# Patient Record
Sex: Male | Born: 1975 | Race: White | Hispanic: No | Marital: Single | Smoking: Never smoker
Health system: Southern US, Community
[De-identification: ages and names within clinical notes are randomized; demographics above are authoritative.]

## PROBLEM LIST (undated history)

## (undated) DIAGNOSIS — F329 Major depressive disorder, single episode, unspecified: Secondary | ICD-10-CM

## (undated) DIAGNOSIS — G4700 Insomnia, unspecified: Secondary | ICD-10-CM

## (undated) DIAGNOSIS — T7840XA Allergy, unspecified, initial encounter: Secondary | ICD-10-CM

## (undated) DIAGNOSIS — F32A Depression, unspecified: Secondary | ICD-10-CM

## (undated) DIAGNOSIS — F419 Anxiety disorder, unspecified: Secondary | ICD-10-CM

## (undated) HISTORY — DX: Major depressive disorder, single episode, unspecified: F32.9

## (undated) HISTORY — DX: Anxiety disorder, unspecified: F41.9

## (undated) HISTORY — DX: Insomnia, unspecified: G47.00

## (undated) HISTORY — DX: Depression, unspecified: F32.A

## (undated) HISTORY — DX: Allergy, unspecified, initial encounter: T78.40XA

---

## 2011-01-24 ENCOUNTER — Ambulatory Visit: Payer: Self-pay | Admitting: Family Medicine

## 2012-08-16 IMAGING — US US THYROID
1 series · 18 of 25 positions shown · non-contrast
Comparison: none

REASON FOR EXAM: goiter hypothyroidism
COMMENTS:

PROCEDURE:     FERIENHAUS - FERIENHAUS THYROID  - January 24, 2011  [DATE]
RESULT:

[Series 1: us thyroid · 18 of 32 slices shown]
[im 1/32]
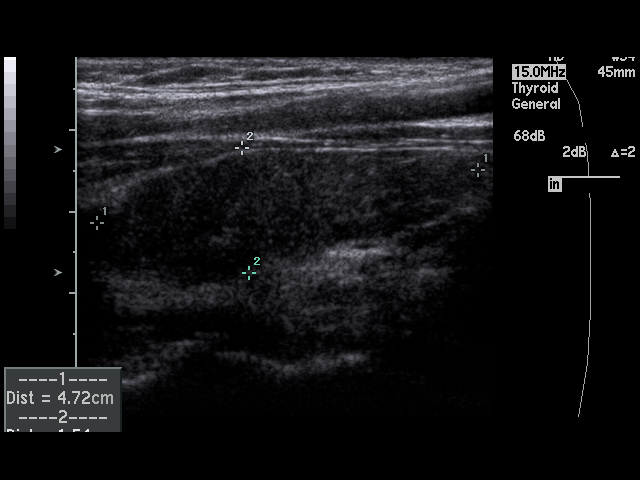
[im 3/32]
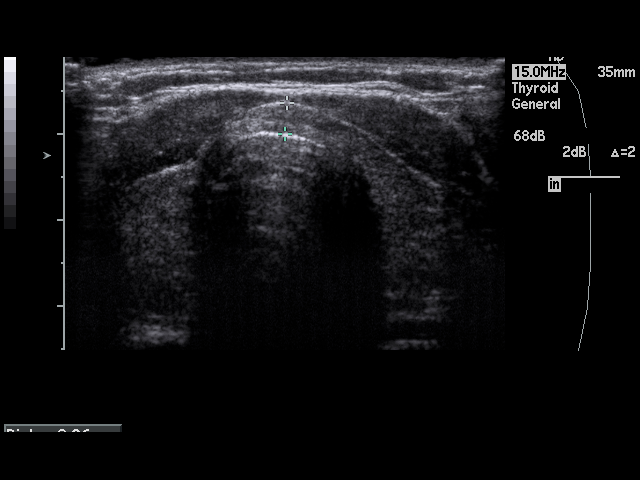
[im 4/32]
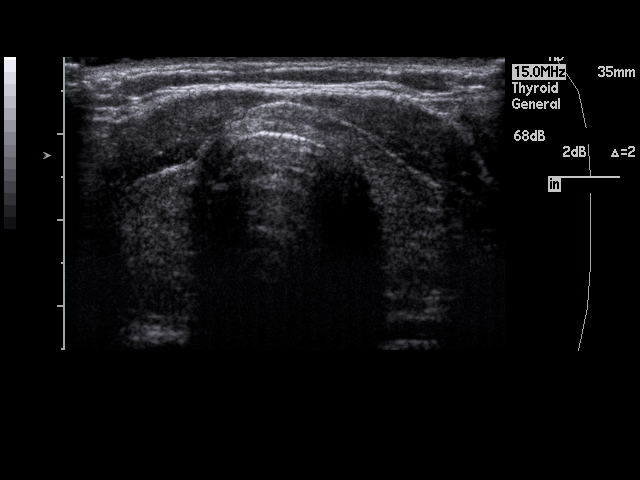
[im 6/32]
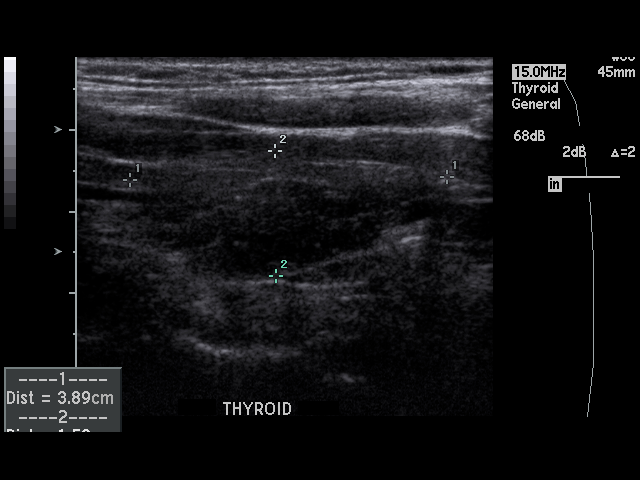
[im 8/32]
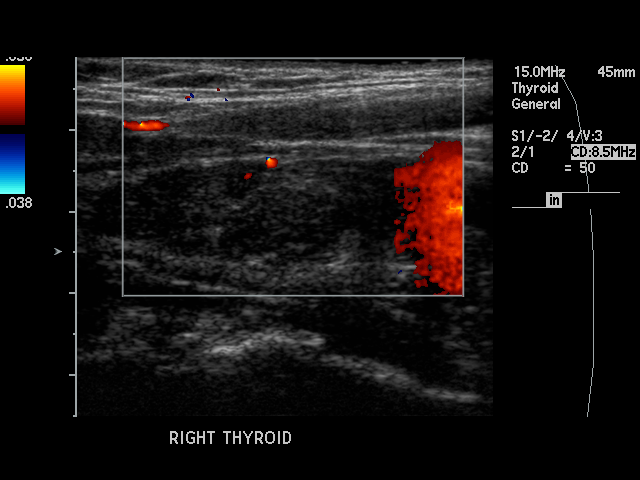
[im 10/32]
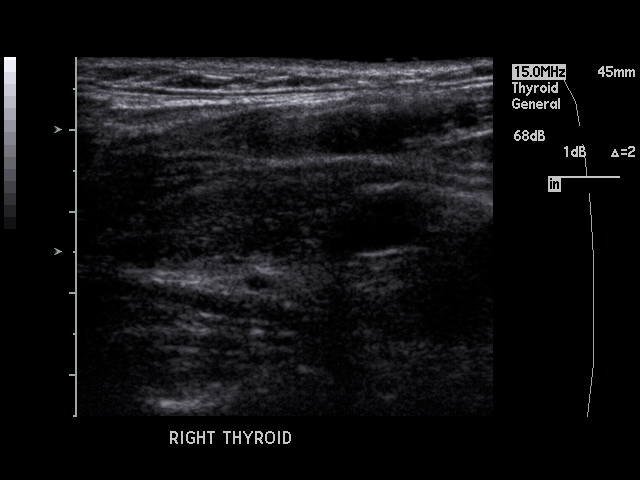
[im 12/32]
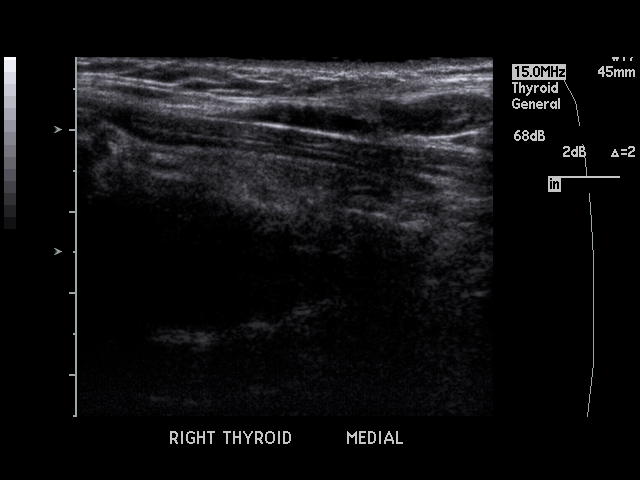
[im 13/32]
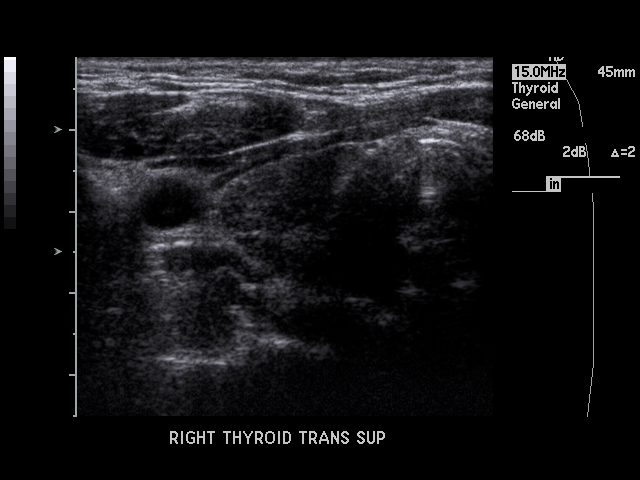
[im 15/32]
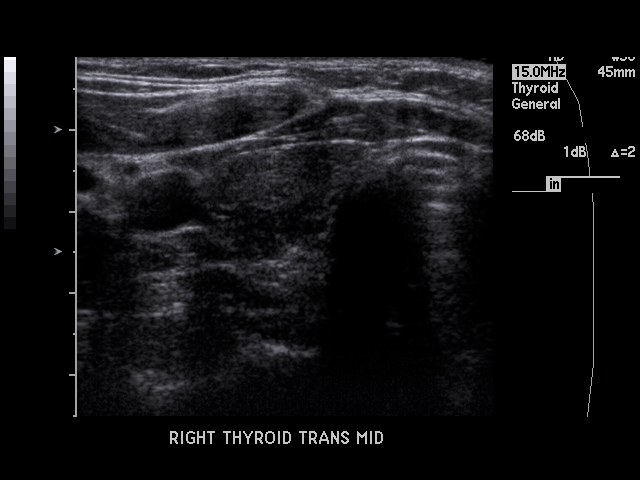
[im 17/32]
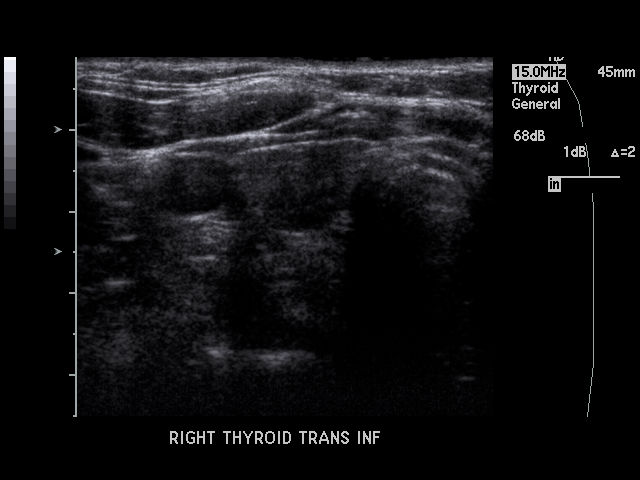
[im 19/32]
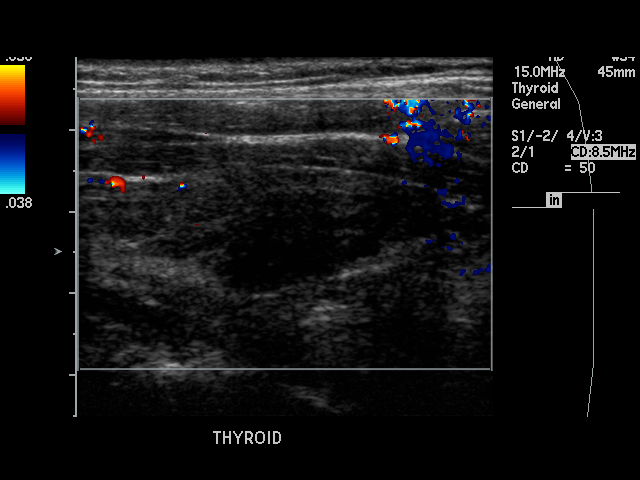
[im 20/32]
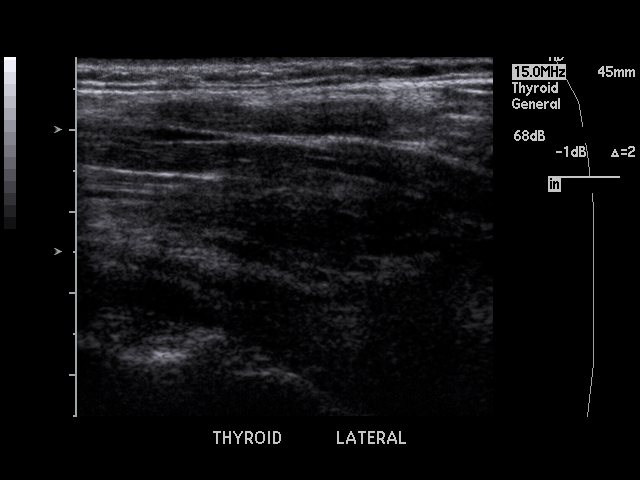
[im 22/32]
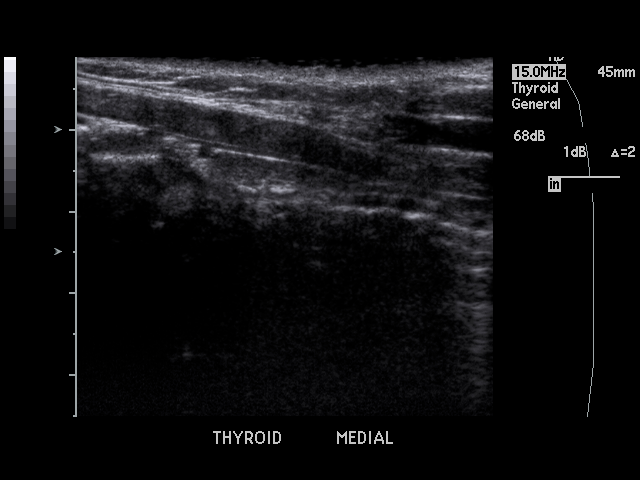
[im 24/32]
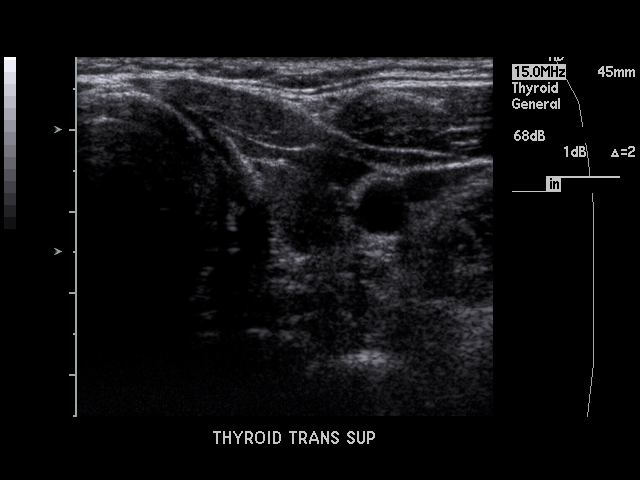
[im 26/32]
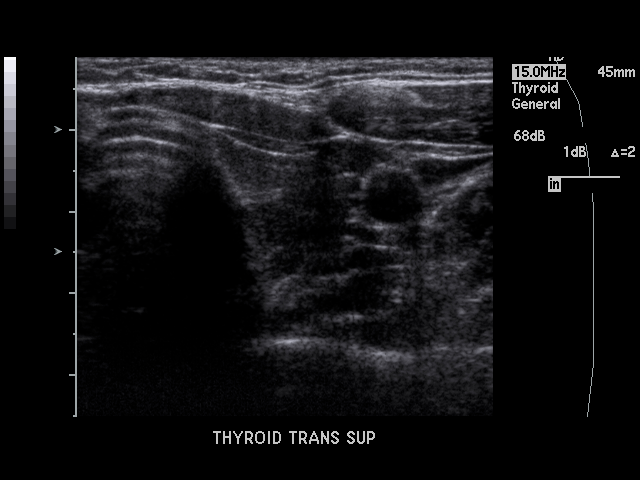
[im 28/32]
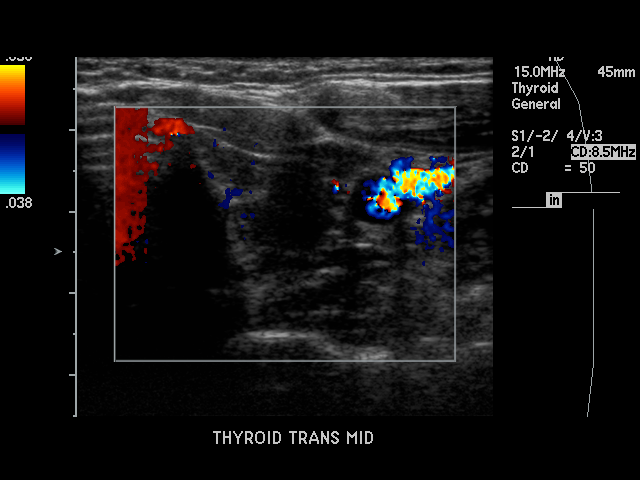
[im 29/32]
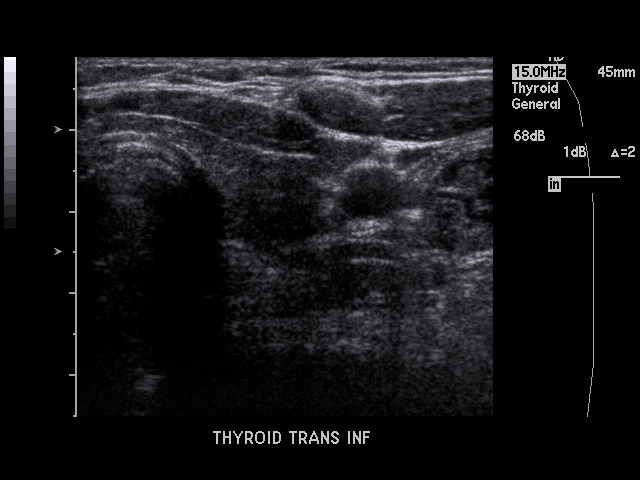
[im 32/32]
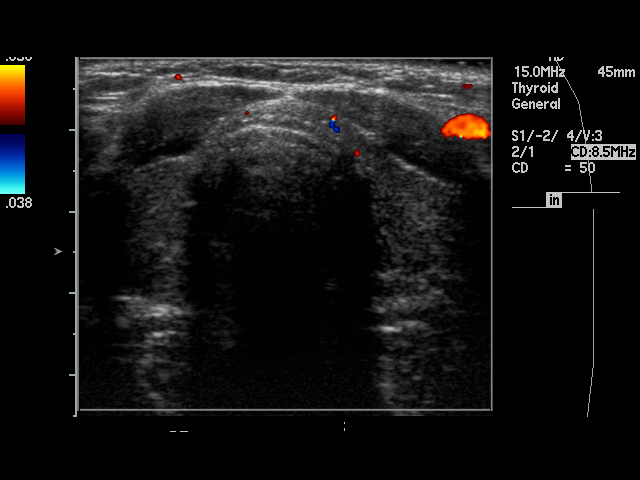

[18 of 25 positions shown; findings below may reference images not displayed]

FINDINGS: The right lobe of the thyroid measures 4.7 x 1.5 x 1.6 cm and the
left measures 3.9 x 1.5 x 1.3 cm. Isthmus thickness is 0.36 cm. The thyroid
demonstrates a homogeneous echotexture. There is no evidence of solid or
cystic masses.
IMPRESSION: Unremarkable Thyroid Ultrasound.

## 2015-07-13 ENCOUNTER — Ambulatory Visit (INDEPENDENT_AMBULATORY_CARE_PROVIDER_SITE_OTHER): Payer: BLUE CROSS/BLUE SHIELD | Admitting: Family Medicine

## 2015-07-13 ENCOUNTER — Encounter: Payer: Self-pay | Admitting: Family Medicine

## 2015-07-13 VITALS — BP 136/77 | HR 100 | Temp 98.4°F | Resp 20 | Ht 73.0 in | Wt 208.8 lb

## 2015-07-13 DIAGNOSIS — F329 Major depressive disorder, single episode, unspecified: Secondary | ICD-10-CM | POA: Diagnosis not present

## 2015-07-13 DIAGNOSIS — I10 Essential (primary) hypertension: Secondary | ICD-10-CM | POA: Insufficient documentation

## 2015-07-13 DIAGNOSIS — G4700 Insomnia, unspecified: Secondary | ICD-10-CM | POA: Diagnosis not present

## 2015-07-13 DIAGNOSIS — Z23 Encounter for immunization: Secondary | ICD-10-CM | POA: Diagnosis not present

## 2015-07-13 DIAGNOSIS — F32A Depression, unspecified: Secondary | ICD-10-CM

## 2015-07-13 DIAGNOSIS — IMO0001 Reserved for inherently not codable concepts without codable children: Secondary | ICD-10-CM

## 2015-07-13 DIAGNOSIS — F330 Major depressive disorder, recurrent, mild: Secondary | ICD-10-CM | POA: Insufficient documentation

## 2015-07-13 DIAGNOSIS — R030 Elevated blood-pressure reading, without diagnosis of hypertension: Secondary | ICD-10-CM

## 2015-07-13 MED ORDER — TRAZODONE HCL 50 MG PO TABS
50.0000 mg | ORAL_TABLET | Freq: Every evening | ORAL | Status: DC | PRN
Start: 2015-07-13 — End: 2017-12-13

## 2015-07-13 MED ORDER — DESVENLAFAXINE ER 100 MG PO TB24: 1.0000 | ORAL_TABLET | Freq: Every day | ORAL | Status: DC

## 2015-07-13 NOTE — Progress Notes (Signed)
Name: Danny Chase   MRN: [id number]    DOB: 10-Sep-1975   Date:07/13/2015       Progress Note  Subjective  Chief Complaint  Chief Complaint  Patient presents with  . Establish Care    NP/ BP Concerns    Insomnia Primary symptoms: no malaise/fatigue.  Past treatments include medication. Typical bedtime:  11-12 P.M..  How long after going to bed to you fall asleep: 15-30 minutes (With Trazodone, he falls asleep quickly).   PMH includes: depression.  Depression        This is a chronic problem.  The onset quality is gradual.   Associated symptoms include insomnia and sad.  Associated symptoms include no helplessness, no hopelessness, no headaches and no suicidal ideas.( Initially, he believes he had 'manic' symptoms, this included racing thoughts, insomnia, 'being wired' etc)  Past treatments include SNRIs - Serotonin and norepinephrine reuptake inhibitors.  Compliance with treatment is good.  Previous treatment provided significant relief.  Risk factors include family history of mental illness.  Elevated Blood Pressure Pt. Presents with concerns about elevated Blood Pressure. He reports pressure at Psychiatrist's office was 150/90 in December 2016. Similarly at work for wellness exam, his Blood pressure was elevated but does not remember the exact numbers. Pt. Reports while growing up, he had low blood pressure.  Past Medical History  Diagnosis Date  . Allergy   . Anxiety   . Depression   . Insomnia     History reviewed. No pertinent past surgical history.  Family History  Problem Relation Age of Onset  . Cancer Mother     Lymphoma  . Cancer Father     Lymphoma, Colon CA  . Hypertension Brother     Social History   Social History  . Marital Status: Single    Spouse Name: N/A  . Number of Children: N/A  . Years of Education: N/A   Occupational History  . Not on file.   Social History Main Topics  . Smoking status: Never Smoker   . Smokeless tobacco: Never Used  .  Alcohol Use: 24.0 oz/week    40 Cans of beer per week     Comment: 6 beers/day  . Drug Use: No  . Sexual Activity: Yes   Other Topics Concern  . Not on file   Social History Narrative  . No narrative on file    Current outpatient prescriptions:  .  cetirizine (ZYRTEC) 10 MG tablet, Take 10 mg by mouth at bedtime., Disp: , Rfl:  .  Desvenlafaxine ER 100 MG TB24, Take 1 tablet by mouth daily., Disp: , Rfl: 11 .  Ergocalciferol (VITAMIN D2 PO), Take 1.25 mg by mouth 2 (two) times a week., Disp: , Rfl:  .  fluticasone (FLONASE) 50 MCG/ACT nasal spray, Place 2 sprays into both nostrils daily., Disp: , Rfl: 4 .  traZODone (DESYREL) 50 MG tablet, Take 50 mg by mouth at bedtime., Disp: , Rfl:   No Known Allergies   Review of Systems  Constitutional: Negative for malaise/fatigue.  Eyes: Negative for blurred vision.  Cardiovascular: Negative for chest pain.  Neurological: Negative for headaches.  Psychiatric/Behavioral: Positive for depression. Negative for suicidal ideas. The patient has insomnia.      Objective  Filed Vitals:   07/13/15 0939  BP: 136/77  Pulse: 100  Temp: 98.4 F (36.9 C)  TempSrc: Oral  Resp: 20  Height:  (1.854 m)  Weight: 208 lb 12.8 oz (94.711 kg)  SpO2: 96%  Physical Exam  Constitutional: He is oriented to person, place, and time and well-developed, well-nourished, and in no distress.  HENT:  Head: Normocephalic and atraumatic.  Cardiovascular: Normal rate and regular rhythm.   Pulmonary/Chest: Effort normal and breath sounds normal.  Abdominal: Soft. Bowel sounds are normal.  Musculoskeletal: He exhibits no edema.  Neurological: He is alert and oriented to person, place, and time.  Psychiatric: Mood, memory, affect and judgment normal.  Nursing note and vitals reviewed.    Assessment & Plan  1. Need for immunization against influenza  - Flu Vaccine QUAD 36+ mos PF IM (Fluarix & Fluzone Quad PF)  2. Depression Continue on  Desferal venlafaxine, we will wait and review records from psychiatry when available. - Desvenlafaxine ER 100 MG TB24; Take 1 tablet (100 mg total) by mouth daily.  Dispense: 30 tablet; Refill: 5  3. Insomnia Insomnia responsive to treatment. Continue. - traZODone (DESYREL) 50 MG tablet; Take 1 tablet (50 mg total) by mouth at bedtime as needed for sleep.  Dispense: 30 tablet; Refill: 5  4. Elevated BP Recommended to check and record BP readings in log and will be reviewed in one month.    Jove Beyl [facility] 07/13/2015 10:00 AM

## 2015-08-15 ENCOUNTER — Ambulatory Visit (INDEPENDENT_AMBULATORY_CARE_PROVIDER_SITE_OTHER): Payer: BLUE CROSS/BLUE SHIELD | Admitting: Family Medicine

## 2015-08-15 ENCOUNTER — Encounter: Payer: Self-pay | Admitting: Family Medicine

## 2015-08-15 VITALS — BP 152/118 | HR 22 | Temp 98.7°F | Resp 109 | Ht 73.0 in | Wt 206.7 lb

## 2015-08-15 DIAGNOSIS — I10 Essential (primary) hypertension: Secondary | ICD-10-CM

## 2015-08-15 MED ORDER — ATENOLOL 50 MG PO TABS: 50.0000 mg | ORAL_TABLET | Freq: Every day | ORAL | Status: DC

## 2015-08-15 NOTE — Progress Notes (Signed)
Name: Danny Chase   MRN: [id number]    DOB: [birth year]   Date:08/15/2015       Progress Note  Subjective  Chief Complaint  Chief Complaint  Patient presents with  . Follow-up    1 MO    Hypertension This is a new problem. The problem is uncontrolled. Pertinent negatives include no blurred vision, chest pain, headaches, palpitations or shortness of breath. Past treatments include nothing. There is no history of kidney disease, CAD/MI or CVA.      Past Medical History  Diagnosis Date  . Allergy   . Anxiety   . Depression   . Insomnia     History reviewed. No pertinent past surgical history.  Family History  Problem Relation Age of Onset  . Cancer Mother     Lymphoma  . Cancer Father     Lymphoma, Colon CA  . Hypertension Brother     Social History   Social History  . Marital Status: Single    Spouse Name: N/A  . Number of Children: N/A  . Years of Education: N/A   Occupational History  . Not on file.   Social History Main Topics  . Smoking status: Never Smoker   . Smokeless tobacco: Never Used  . Alcohol Use: 24.0 oz/week    40 Cans of beer per week     Comment: 6 beers/day  . Drug Use: No  . Sexual Activity: Yes   Other Topics Concern  . Not on file   Social History Narrative     Current outpatient prescriptions:  .  cetirizine (ZYRTEC) 10 MG tablet, Take 10 mg by mouth at bedtime., Disp: , Rfl:  .  Desvenlafaxine ER 100 MG TB24, Take 1 tablet (100 mg total) by mouth daily., Disp: 30 tablet, Rfl: 5 .  Ergocalciferol (VITAMIN D2 PO), Take 1.25 mg by mouth 2 (two) times a week., Disp: , Rfl:  .  fluticasone (FLONASE) 50 MCG/ACT nasal spray, Place 2 sprays into both nostrils daily., Disp: , Rfl: 4 .  traZODone (DESYREL) 50 MG tablet, Take 1 tablet (50 mg total) by mouth at bedtime as needed for sleep., Disp: 30 tablet, Rfl: 5  No Known Allergies   Review of Systems  Eyes: Negative for blurred vision.  Respiratory: Negative for shortness of  breath.   Cardiovascular: Negative for chest pain and palpitations.  Neurological: Negative for headaches.    Objective  Filed Vitals:   08/15/15 0910  BP: 152/118  Pulse: 22  Temp: 98.7 F (37.1 C)  TempSrc: Oral  Resp: 109  Height:  (1.854 m)  Weight: 206 lb 11.2 oz (93.759 kg)  SpO2: 97%    Physical Exam  Constitutional: He is oriented to person, place, and time and well-developed, well-nourished, and in no distress.  HENT:  Head: Normocephalic and atraumatic.  Eyes: Pupils are equal, round, and reactive to light.  Cardiovascular: Regular rhythm and normal heart sounds.  Tachycardia present.   Pulmonary/Chest: Breath sounds normal.  Musculoskeletal:       Right ankle: He exhibits no swelling.       Left ankle: He exhibits no swelling.  Neurological: He is alert and oriented to person, place, and time.  Nursing note and vitals reviewed.      Assessment & Plan  1. Essential hypertension Blood pressure is persistently elevated, along with tachycardia. We will start on atenolol 50 mg daily for treatment. Educated on potential adverse effects, including possible worsening of depression, hypotension, bradycardia  and cautioned against abrupt discontinuation. Advised to check BP and keep logs for review in 3 weeks. Verbalized agreement. - atenolol (TENORMIN) 50 MG tablet; Take 1 tablet (50 mg total) by mouth daily.  Dispense: 90 tablet; Refill: 0   Danny Chase [facility] 08/15/2015 9:34 AM

## 2015-09-01 ENCOUNTER — Ambulatory Visit (INDEPENDENT_AMBULATORY_CARE_PROVIDER_SITE_OTHER): Payer: BLUE CROSS/BLUE SHIELD | Admitting: Family Medicine

## 2015-09-01 ENCOUNTER — Encounter: Payer: Self-pay | Admitting: Family Medicine

## 2015-09-01 VITALS — BP 140/74 | HR 90 | Temp 98.3°F | Resp 18 | Ht 73.0 in | Wt 208.1 lb

## 2015-09-01 DIAGNOSIS — I10 Essential (primary) hypertension: Secondary | ICD-10-CM

## 2015-09-01 MED ORDER — ATENOLOL 50 MG PO TABS: 50.0000 mg | ORAL_TABLET | Freq: Every day | ORAL | Status: DC

## 2015-09-01 NOTE — Progress Notes (Signed)
Name: Ellis ParentsJoshua N Aurich   MRN: [id number]    DOB: 09-19-1975   Date:09/01/2015       Progress Note  Subjective  Chief Complaint  Chief Complaint  Patient presents with  . Follow-up    3 wk    HPI  Hypertension: BP improved from last month. Decreased from 152/118 last month to 140/74 today. He has been taking Atenolol 50 mg daily in AM. Heart rate also improved from 109 to 90 bpm today. Feels well, no side effects.   Past Medical History  Diagnosis Date  . Allergy   . Anxiety   . Depression   . Insomnia     History reviewed. No pertinent past surgical history.  Family History  Problem Relation Age of Onset  . Cancer Mother     Lymphoma  . Cancer Father     Lymphoma, Colon CA  . Hypertension Brother     Social History   Social History  . Marital Status: Single    Spouse Name: N/A  . Number of Children: N/A  . Years of Education: N/A   Occupational History  . Not on file.   Social History Main Topics  . Smoking status: Never Smoker   . Smokeless tobacco: Never Used  . Alcohol Use: 24.0 oz/week    40 Cans of beer per week     Comment: 6 beers/day  . Drug Use: No  . Sexual Activity: Yes   Other Topics Concern  . Not on file   Social History Narrative     Current outpatient prescriptions:  .  atenolol (TENORMIN) 50 MG tablet, Take 1 tablet (50 mg total) by mouth daily., Disp: 90 tablet, Rfl: 0 .  cetirizine (ZYRTEC) 10 MG tablet, Take 10 mg by mouth at bedtime., Disp: , Rfl:  .  Desvenlafaxine ER 100 MG TB24, Take 1 tablet (100 mg total) by mouth daily., Disp: 30 tablet, Rfl: 5 .  Ergocalciferol (VITAMIN D2 PO), Take 1.25 mg by mouth 2 (two) times a week., Disp: , Rfl:  .  fluticasone (FLONASE) 50 MCG/ACT nasal spray, Place 2 sprays into both nostrils daily., Disp: , Rfl: 4 .  traZODone (DESYREL) 50 MG tablet, Take 1 tablet (50 mg total) by mouth at bedtime as needed for sleep., Disp: 30 tablet, Rfl: 5  No Known Allergies   Review of Systems   Constitutional: Negative for fever and chills.  Respiratory: Negative for shortness of breath.   Cardiovascular: Negative for chest pain and palpitations.      Objective  Filed Vitals:   09/01/15 1607  BP: 140/74  Pulse: 90  Temp: 98.3 F (36.8 C)  TempSrc: Oral  Resp: 18  Height: 6\' 1"  (1.854 m)  Weight: 208 lb 1.6 oz (94.394 kg)  SpO2: 97%    Physical Exam  Constitutional: He is oriented to person, place, and time and well-developed, well-nourished, and in no distress.  HENT:  Head: Normocephalic and atraumatic.  Cardiovascular: Normal rate, regular rhythm and normal heart sounds.   Pulmonary/Chest: Effort normal and breath sounds normal.  Neurological: He is alert and oriented to person, place, and time.  Nursing note and vitals reviewed.      Assessment & Plan  1. Essential hypertension Blood pressure is improved, tachycardia has resolved. No side effects on therapy. Continue present management. - atenolol (TENORMIN) 50 MG tablet; Take 1 tablet (50 mg total) by mouth daily.  Dispense: 90 tablet; Refill: 0   Estelle Greenleaf [facility] 09/01/2015 4:19 PM

## 2015-11-29 DIAGNOSIS — F3342 Major depressive disorder, recurrent, in full remission: Secondary | ICD-10-CM | POA: Diagnosis not present

## 2015-11-29 DIAGNOSIS — G4709 Other insomnia: Secondary | ICD-10-CM | POA: Diagnosis not present

## 2015-12-05 ENCOUNTER — Encounter: Payer: Self-pay | Admitting: Family Medicine

## 2015-12-05 ENCOUNTER — Ambulatory Visit (INDEPENDENT_AMBULATORY_CARE_PROVIDER_SITE_OTHER): Payer: BLUE CROSS/BLUE SHIELD | Admitting: Family Medicine

## 2015-12-05 VITALS — BP 137/71 | HR 76 | Temp 98.1°F | Resp 15 | Ht 73.0 in | Wt 204.7 lb

## 2015-12-05 DIAGNOSIS — F329 Major depressive disorder, single episode, unspecified: Secondary | ICD-10-CM

## 2015-12-05 DIAGNOSIS — I10 Essential (primary) hypertension: Secondary | ICD-10-CM | POA: Diagnosis not present

## 2015-12-05 DIAGNOSIS — F32A Depression, unspecified: Secondary | ICD-10-CM

## 2015-12-05 MED ORDER — ATENOLOL 50 MG PO TABS: 50.0000 mg | ORAL_TABLET | Freq: Every day | ORAL | Status: DC

## 2015-12-05 NOTE — Progress Notes (Signed)
Name: Danny Chase   MRN: [id number]    DOB: [birth year]   Date:12/05/2015       Progress Note  Subjective  Chief Complaint  Chief Complaint  Patient presents with  . Follow-up    3 mo  . Hypertension    Hypertension This is a chronic problem. The problem is controlled. Pertinent negatives include no blurred vision, chest pain, headaches or palpitations. Past treatments include beta blockers. There is no history of kidney disease, CAD/MI or CVA.    Past Medical History  Diagnosis Date  . Allergy   . Anxiety   . Depression   . Insomnia     History reviewed. No pertinent past surgical history.  Family History  Problem Relation Age of Onset  . Cancer Mother     Lymphoma  . Cancer Father     Lymphoma, Colon CA  . Hypertension Brother     Social History   Social History  . Marital Status: Single    Spouse Name: N/A  . Number of Children: N/A  . Years of Education: N/A   Occupational History  . Not on file.   Social History Main Topics  . Smoking status: Never Smoker   . Smokeless tobacco: Never Used  . Alcohol Use: 24.0 oz/week    40 Cans of beer per week     Comment: 6 beers/day  . Drug Use: No  . Sexual Activity: Yes   Other Topics Concern  . Not on file   Social History Narrative    Current outpatient prescriptions:  .  atenolol (TENORMIN) 50 MG tablet, Take 1 tablet (50 mg total) by mouth daily., Disp: 90 tablet, Rfl: 0 .  cetirizine (ZYRTEC) 10 MG tablet, Take 10 mg by mouth at bedtime., Disp: , Rfl:  .  Desvenlafaxine ER 100 MG TB24, Take 1 tablet (100 mg total) by mouth daily., Disp: 30 tablet, Rfl: 5 .  fluticasone (FLONASE) 50 MCG/ACT nasal spray, Place 2 sprays into both nostrils daily., Disp: , Rfl: 4 .  traZODone (DESYREL) 50 MG tablet, Take 1 tablet (50 mg total) by mouth at bedtime as needed for sleep., Disp: 30 tablet, Rfl: 5  No Known Allergies   Review of Systems  Eyes: Negative for blurred vision.  Cardiovascular: Negative for  chest pain and palpitations.  Neurological: Negative for headaches.    Objective  Filed Vitals:   12/05/15 0849  BP: 137/71  Pulse: 76  Temp: 98.1 F (36.7 C)  TempSrc: Oral  Resp: 15  Height:  (1.854 m)  Weight: 204 lb 11.2 oz (92.851 kg)  SpO2: 96%    Physical Exam  Constitutional: He is oriented to person, place, and time and well-developed, well-nourished, and in no distress.  HENT:  Head: Normocephalic and atraumatic.  Eyes: Pupils are equal, round, and reactive to light.  Cardiovascular: Normal rate and regular rhythm.   No murmur heard. Pulmonary/Chest: Effort normal and breath sounds normal. No respiratory distress. He has no wheezes. He has no rales.  Musculoskeletal:       Right ankle: He exhibits no swelling.       Left ankle: He exhibits no swelling.  Neurological: He is alert and oriented to person, place, and time.  Nursing note and vitals reviewed.      Assessment & Plan  1. Depression Pt. Is being followed by Psychiatry Dr. Evelene Croon in [facility] for management of Depression.  2. Essential hypertension Blood prBAPTISTE LITTLERable and controlled on present antihypertensive  therapy. - atenolol (TENORMIN) 50 MG tablet; Take 1 tablet (50 mg total) by mouth daily.  Dispense: 90 tablet; Refill: 1   Danny Chase [facility] 12/05/2015 9:11 AM

## 2016-06-05 ENCOUNTER — Ambulatory Visit: Payer: BLUE CROSS/BLUE SHIELD | Admitting: Family Medicine

## 2016-06-13 ENCOUNTER — Ambulatory Visit (INDEPENDENT_AMBULATORY_CARE_PROVIDER_SITE_OTHER): Payer: BLUE CROSS/BLUE SHIELD | Admitting: Family Medicine

## 2016-06-13 ENCOUNTER — Encounter: Payer: Self-pay | Admitting: Family Medicine

## 2016-06-13 DIAGNOSIS — I10 Essential (primary) hypertension: Secondary | ICD-10-CM

## 2016-06-13 MED ORDER — ATENOLOL 50 MG PO TABS: 50.0000 mg | ORAL_TABLET | Freq: Every day | ORAL | 1 refills | Status: DC

## 2016-06-13 NOTE — Progress Notes (Signed)
Name: Danny Chase   MRN: [id number]    DOB: 07/13/1975   Date:06/13/2016       Progress Note  Subjective  Chief Complaint  Chief Complaint  Patient presents with  . Follow-up    6 mo    Hypertension  This is a chronic problem. The problem is controlled. Pertinent negatives include no blurred vision, chest pain, headaches, palpitations or shortness of breath. Past treatments include beta blockers. There is no history of kidney disease, CAD/MI or CVA.     Past Medical History:  Diagnosis Date  . Allergy   . Anxiety   . Depression   . Insomnia     History reviewed. No pertinent surgical history.  Family History  Problem Relation Age of Onset  . Cancer Mother     Lymphoma  . Cancer Father     Lymphoma, Colon CA  . Hypertension Brother     Social History   Social History  . Marital status: Single    Spouse name: N/A  . Number of children: N/A  . Years of education: N/A   Occupational History  . Not on file.   Social History Main Topics  . Smoking status: Never Smoker  . Smokeless tobacco: Never Used  . Alcohol use 24.0 oz/week    40 Cans of beer per week     Comment: 6 beers/day  . Drug use: No  . Sexual activity: Yes   Other Topics Concern  . Not on file   Social History Narrative  . No narrative on file     Current Outpatient Prescriptions:  .  atenolol (TENORMIN) 50 MG tablet, Take 1 tablet (50 mg total) by mouth daily., Disp: 90 tablet, Rfl: 1 .  cetirizine (ZYRTEC) 10 MG tablet, Take 10 mg by mouth at bedtime., Disp: , Rfl:  .  Desvenlafaxine ER 100 MG TB24, Take 1 tablet (100 mg total) by mouth daily., Disp: 30 tablet, Rfl: 5 .  fluticasone (FLONASE) 50 MCG/ACT nasal spray, Place 2 sprays into both nostrils daily., Disp: , Rfl: 4 .  traZODone (DESYREL) 50 MG tablet, Take 1 tablet (50 mg total) by mouth at bedtime as needed for sleep., Disp: 30 tablet, Rfl: 5  No Known Allergies   Review of Systems  Eyes: Negative for blurred vision.   Respiratory: Negative for shortness of breath.   Cardiovascular: Negative for chest pain and palpitations.  Neurological: Negative for headaches.     Objective  Vitals:   06/13/16 1521  BP: 134/73  Pulse: 70  Resp: 16  Temp: 98.6 F (37 C)  TempSrc: Oral  SpO2: 96%  Weight: 204 lb (92.5 kg)  Height: 6\' 1"  (1.854 m)    Physical Exam  Constitutional: He is oriented to person, place, and time and well-developed, well-nourished, and in no distress.  HENT:  Head: Normocephalic and atraumatic.  Cardiovascular: Normal rate, regular rhythm and normal heart sounds.   No murmur heard. Pulmonary/Chest: Effort normal and breath sounds normal. He has no wheezes.  Neurological: He is alert and oriented to person, place, and time.  Psychiatric: Mood, memory, affect and judgment normal.  Nursing note and vitals reviewed.       Assessment & Plan  1. Essential hypertension  - atenolol (TENORMIN) 50 MG tablet; Take 1 tablet (50 mg total) by mouth daily.  Dispense: 90 tablet; Refill: 1   Danny Chase Danny Chase [facility] 06/13/2016 3:29 PM

## 2016-08-29 DIAGNOSIS — F331 Major depressive disorder, recurrent, moderate: Secondary | ICD-10-CM | POA: Diagnosis not present

## 2016-12-05 DIAGNOSIS — F3342 Major depressive disorder, recurrent, in full remission: Secondary | ICD-10-CM | POA: Diagnosis not present

## 2016-12-13 ENCOUNTER — Encounter: Payer: Self-pay | Admitting: Family Medicine

## 2016-12-13 ENCOUNTER — Ambulatory Visit (INDEPENDENT_AMBULATORY_CARE_PROVIDER_SITE_OTHER): Payer: BLUE CROSS/BLUE SHIELD | Admitting: Family Medicine

## 2016-12-13 VITALS — BP 131/80 | HR 96 | Temp 97.7°F | Resp 16 | Ht 71.0 in | Wt 193.0 lb

## 2016-12-13 DIAGNOSIS — I10 Essential (primary) hypertension: Secondary | ICD-10-CM | POA: Diagnosis not present

## 2016-12-13 DIAGNOSIS — Z0000 Encounter for general adult medical examination without abnormal findings: Secondary | ICD-10-CM

## 2016-12-13 DIAGNOSIS — Z23 Encounter for immunization: Secondary | ICD-10-CM

## 2016-12-13 LAB — CBC WITH DIFFERENTIAL/PLATELET
BASOS ABS: 0 {cells}/uL (ref 0–200)
Basophils Relative: 0 %
EOS ABS: 96 {cells}/uL (ref 15–500)
Eosinophils Relative: 2 %
HEMATOCRIT: 49.8 % (ref 38.5–50.0)
HEMOGLOBIN: 17.2 g/dL — AB (ref 13.2–17.1)
LYMPHS ABS: 1248 {cells}/uL (ref 850–3900)
Lymphocytes Relative: 26 %
MCH: 31.7 pg (ref 27.0–33.0)
MCHC: 34.5 g/dL (ref 32.0–36.0)
MCV: 91.7 fL (ref 80.0–100.0)
MONO ABS: 240 {cells}/uL (ref 200–950)
MPV: 9.6 fL (ref 7.5–12.5)
Monocytes Relative: 5 %
NEUTROS PCT: 67 %
Neutro Abs: 3216 cells/uL (ref 1500–7800)
Platelets: 155 10*3/uL (ref 140–400)
RBC: 5.43 MIL/uL (ref 4.20–5.80)
RDW: 14.7 % (ref 11.0–15.0)
WBC: 4.8 10*3/uL (ref 3.8–10.8)

## 2016-12-13 LAB — TSH: TSH: 1.85 m[IU]/L (ref 0.40–4.50)

## 2016-12-13 MED ORDER — ATENOLOL 50 MG PO TABS: 50.0000 mg | ORAL_TABLET | Freq: Every day | ORAL | 1 refills | Status: DC

## 2016-12-13 NOTE — Progress Notes (Signed)
Name: Danny Chase   MRN: [id number]    DOB: [birth year]   Date:12/13/2016       Progress Note  Subjective  Chief Complaint  Chief Complaint  Patient presents with  . Annual Exam    CPE    HPI  Pt. Presents for Complete Physical Exam.    Past Medical History:  Diagnosis Date  . Allergy   . Anxiety   . Depression   . Insomnia     History reviewed. No pertinent surgical history.  Family History  Problem Relation Age of Onset  . Cancer Mother        Lymphoma  . Stroke Mother   . Cancer Father        Lymphoma, Colon CA  . Hypertension Brother     Social History   Social History  . Marital status: Single    Spouse name: N/A  . Number of children: N/A  . Years of education: N/A   Occupational History  . Not on file.   Social History Main Topics  . Smoking status: Never Smoker  . Smokeless tobacco: Never Used  . Alcohol use 24.0 oz/week    40 Cans of beer per week     Comment: 6 beers/day  . Drug use: No  . Sexual activity: Yes   Other Topics Concern  . Not on file   Social History Narrative  . No narrative on file     Current Outpatient Prescriptions:  .  atenolol (TENORMIN) 50 MG tablet, Take 1 tablet (50 mg total) by mouth daily., Disp: 90 tablet, Rfl: 1 .  cetirizine (ZYRTEC) 10 MG tablet, Take 10 mg by mouth at bedtime., Disp: , Rfl:  .  Desvenlafaxine ER 100 MG TB24, Take 1 tablet (100 mg total) by mouth daily., Disp: 30 tablet, Rfl: 5 .  fluticasone (FLONASE) 50 MCG/ACT nasal spray, Place 2 sprays into both nostrils daily., Disp: , Rfl: 4 .  traZODone (DESYREL) 50 MG tablet, Take 1 tablet (50 mg total) by mouth at bedtime as needed for sleep., Disp: 30 tablet, Rfl: 5  No Known Allergies   Review of Systems  Constitutional: Negative for chills, fever and malaise/fatigue.  HENT: Negative for congestion and sore throat.   Eyes: Negative for blurred vision and double vision.  Respiratory: Negative for cough and shortness of breath.    Cardiovascular: Negative for chest pain and leg swelling.  Gastrointestinal: Negative for blood in stool, constipation, diarrhea, nausea and vomiting.  Genitourinary: Negative for hematuria.  Musculoskeletal: Negative for back pain, joint pain and neck pain.  Neurological: Negative for dizziness and headaches.  Psychiatric/Behavioral: Positive for depression. The patient is not nervous/anxious and does not have insomnia.      Objective  Vitals:   12/13/16 0906  BP: 131/80  Pulse: 96  Resp: 16  Temp: 97.7 F (36.5 C)  TempSrc: Oral  SpO2: 98%  Weight: 193 lb (87.5 kg)  Height: 5\' 11"  (1.803 m)    Physical Exam  Constitutional: He is oriented to person, place, and time and well-developed, well-nourished, and in no distress.  HENT:  Head: Normocephalic and atraumatic.  Right Ear: External ear normal.  Left Ear: External ear normal.  Mouth/Throat: Oropharynx is clear and moist.  Left and right ear canal with cerumen impaction.  Eyes: Conjunctivae and EOM are normal.  Neck: Neck supple.  Cardiovascular: Normal rate, regular rhythm, S1 normal, S2 normal and normal heart sounds.   No murmur heard. Pulmonary/Chest: Effort normal and  breath sounds normal. He has no wheezes.  Abdominal: Soft. Bowel sounds are normal. There is no tenderness.  Musculoskeletal:       Right ankle: He exhibits no swelling.       Left ankle: He exhibits no swelling.  Neurological: He is alert and oriented to person, place, and time.  Psychiatric: Mood, memory, affect and judgment normal.  Nursing note and vitals reviewed.     Assessment & Plan  1. Annual physical exam Obtain age-appropriate laboratory screenings - CBC with Differential/Platelet - COMPLETE METABOLIC PANEL WITH GFR - TSH - VITAMIN D 25 Hydroxy (Vit-D Deficiency, Fractures) - Lipid panel - PSA  2. Essential hypertension BP stable on present antihypertensive therapy - atenolol (TENORMIN) 50 MG tablet; Take 1 tablet (50 mg  total) by mouth daily.  Dispense: 90 tablet; Refill: 1  3. Need for diphtheria-tetanus-pertussis (Tdap) vaccine  - Tdap vaccine greater than or equal to 7yo IM   Eirik Schueler [facility] 12/13/2016 9:21 AM

## 2016-12-14 LAB — COMPLETE METABOLIC PANEL WITH GFR
ALBUMIN: 4.4 g/dL (ref 3.6–5.1)
ALK PHOS: 100 U/L (ref 40–115)
ALT: 58 U/L — ABNORMAL HIGH (ref 9–46)
AST: 41 U/L — AB (ref 10–40)
BUN: 6 mg/dL — AB (ref 7–25)
CALCIUM: 9.2 mg/dL (ref 8.6–10.3)
CO2: 23 mmol/L (ref 20–31)
Chloride: 101 mmol/L (ref 98–110)
Creat: 0.82 mg/dL (ref 0.60–1.35)
GFR, Est African American: >89 mL/min (ref >=60)
GFR, Est Non African American: >89 mL/min (ref >=60)
Glucose, Bld: 82 mg/dL (ref 65–99)
POTASSIUM: 4.7 mmol/L (ref 3.5–5.3)
Sodium: 137 mmol/L (ref 135–146)
Total Bilirubin: 0.4 mg/dL (ref 0.2–1.2)
Total Protein: 7.8 g/dL (ref 6.1–8.1)

## 2016-12-14 LAB — LIPID PANEL
Cholesterol: 192 mg/dL (ref <200)
HDL: 68 mg/dL (ref >40)
LDL CALC: 78 mg/dL (ref <100)
TRIGLYCERIDES: 232 mg/dL — AB (ref <150)
Total CHOL/HDL Ratio: 2.8 Ratio (ref <5.0)
VLDL: 46 mg/dL — ABNORMAL HIGH (ref <30)

## 2016-12-14 LAB — PSA: PSA: 0.6 ng/mL (ref <=4.0)

## 2016-12-14 LAB — VITAMIN D 25 HYDROXY (VIT D DEFICIENCY, FRACTURES): Vit D, 25-Hydroxy: 25 ng/mL — ABNORMAL LOW (ref 30–100)

## 2016-12-25 ENCOUNTER — Telehealth: Payer: Self-pay

## 2016-12-25 MED ORDER — VITAMIN D (ERGOCALCIFEROL) 1.25 MG (50000 UNIT) PO CAPS: 50000.0000 [IU] | ORAL_CAPSULE | ORAL | 0 refills | Status: DC

## 2016-12-25 NOTE — Telephone Encounter (Signed)
Patient has been notified of lab results and a prescription for vitamin D3 50,000 units take 1 capsule once a week x12 weeks has been sent to [facility] [address] per Dr. Shah, patient has been notified  

## 2017-03-22 ENCOUNTER — Other Ambulatory Visit: Payer: Self-pay | Admitting: Family Medicine

## 2017-04-05 ENCOUNTER — Other Ambulatory Visit: Payer: Self-pay | Admitting: Family Medicine

## 2017-06-14 ENCOUNTER — Encounter: Payer: Self-pay | Admitting: Family Medicine

## 2017-06-14 ENCOUNTER — Ambulatory Visit: Payer: BLUE CROSS/BLUE SHIELD | Admitting: Family Medicine

## 2017-06-14 VITALS — BP 126/82 | HR 75 | Temp 97.8°F | Resp 16 | Ht 71.0 in | Wt 200.2 lb

## 2017-06-14 DIAGNOSIS — R1310 Dysphagia, unspecified: Secondary | ICD-10-CM | POA: Diagnosis not present

## 2017-06-14 DIAGNOSIS — F3342 Major depressive disorder, recurrent, in full remission: Secondary | ICD-10-CM | POA: Diagnosis not present

## 2017-06-14 DIAGNOSIS — E559 Vitamin D deficiency, unspecified: Secondary | ICD-10-CM

## 2017-06-14 DIAGNOSIS — I10 Essential (primary) hypertension: Secondary | ICD-10-CM

## 2017-06-14 MED ORDER — ATENOLOL 50 MG PO TABS: 50.0000 mg | ORAL_TABLET | Freq: Every day | ORAL | 1 refills | Status: DC

## 2017-06-14 MED ORDER — DESVENLAFAXINE ER 100 MG PO TB24: 1.0000 | ORAL_TABLET | Freq: Every day | ORAL | 1 refills | Status: DC

## 2017-06-14 NOTE — Progress Notes (Signed)
Name: Danny Chase   MRN: [id number]    DOB: 08/29/1975   Date:06/14/2017       Progress Note  Subjective  Chief Complaint  Chief Complaint  Patient presents with  . Medication Refill    6 month F/U  . Hypertension    Denies any symptoms  . Depression  . Vitamin D Deficiency    Needs Refill    Hypertension  This is a chronic problem. The problem is unchanged. The problem is controlled. Pertinent negatives include no blurred vision, chest pain, headaches or palpitations. Past treatments include beta blockers. There is no history of kidney disease, CAD/MI or CVA.  Depression         This is a chronic problem.  The problem has been gradually improving since onset.  Associated symptoms include no fatigue, no helplessness, no decreased interest and no headaches.  Past treatments include SNRIs - Serotonin and norepinephrine reuptake inhibitors. Pt. has vitamin D insufficiency, last level was 25 ng/dL, he took 12 weeks of Vitamin D 50,000 units and will recheck levels today.  Pt. has noticed at times that he has difficulty swallowing or experiences choking on food that he did not before, has remote history of acid reflux but no current symptoms. He reports no weight loss, melena, or fatigue, difficulty swallowing happens mainly with solid foods.   Past Medical History:  Diagnosis Date  . Allergy   . Anxiety   . Depression   . Insomnia     No past surgical history on file.  Family History  Problem Relation Age of Onset  . Cancer Mother        Lymphoma  . Stroke Mother   . Cancer Father        Lymphoma, Colon CA  . Hypertension Brother     Social History   Socioeconomic History  . Marital status: Single    Spouse name: Not on file  . Number of children: Not on file  . Years of education: Not on file  . Highest education level: Not on file  Social Needs  . Financial resource strain: Not on file  . Food insecurity - worry: Not on file  . Food insecurity - inability:  Not on file  . Transportation needs - medical: Not on file  . Transportation needs - non-medical: Not on file  Occupational History  . Not on file  Tobacco Use  . Smoking status: Never Smoker  . Smokeless tobacco: Never Used  Substance and Sexual Activity  . Alcohol use: Yes    Alcohol/week: 24.0 oz    Types: 40 Cans of beer per week    Comment: 6 beers/day  . Drug use: No  . Sexual activity: Yes  Other Topics Concern  . Not on file  Social History Narrative  . Not on file     Current Outpatient Medications:  .  atenolol (TENORMIN) 50 MG tablet, Take 1 tablet (50 mg total) by mouth daily., Disp: 90 tablet, Rfl: 1 .  cetirizine (ZYRTEC) 10 MG tablet, Take 10 mg by mouth at bedtime., Disp: , Rfl:  .  Desvenlafaxine ER 100 MG TB24, Take 1 tablet (100 mg total) by mouth daily., Disp: 30 tablet, Rfl: 5 .  fluticasone (FLONASE) 50 MCG/ACT nasal spray, Place 2 sprays into both nostrils daily., Disp: , Rfl: 4 .  traZODone (DESYREL) 50 MG tablet, Take 1 tablet (50 mg total) by mouth at bedtime as needed for sleep., Disp: 30 tablet, Rfl: 5 .  Vitamin D, Ergocalciferol, (DRISDOL) 50000 units CAPS capsule, Take 1 capsule (50,000 Units total) by mouth once a week. For 12 weeks, Disp: 12 capsule, Rfl: 0  No Known Allergies   Review of Systems  Constitutional: Negative for fatigue.  Eyes: Negative for blurred vision.  Cardiovascular: Negative for chest pain and palpitations.  Neurological: Negative for headaches.  Psychiatric/Behavioral: Positive for depression.      Objective  Vitals:   06/14/17 0918  BP: 126/82  Pulse: 75  Resp: 16  Temp: 97.8 F (36.6 C)  TempSrc: Oral  SpO2: 96%  Weight: 200 lb 3.2 oz (90.8 kg)  Height: 5\' 11"  (1.803 m)    Physical Exam  Constitutional: He is oriented to person, place, and time and well-developed, well-nourished, and in no distress.  HENT:  Head: Normocephalic and atraumatic.  Mouth/Throat: No posterior oropharyngeal erythema.   Cardiovascular: Normal rate, regular rhythm and normal heart sounds.  No murmur heard. Pulmonary/Chest: Effort normal and breath sounds normal. He has no wheezes.  Abdominal: Soft. Bowel sounds are normal. There is no tenderness.  Musculoskeletal: He exhibits no edema.  Neurological: He is alert and oriented to person, place, and time.  Psychiatric: Mood, memory, affect and judgment normal.  Nursing note and vitals reviewed.    Assessment & Plan  1. Essential hypertension EP stable on present hypertensive treatment - atenolol (TENORMIN) 50 MG tablet; Take 1 tablet (50 mg total) by mouth daily.  Dispense: 90 tablet; Refill: 1  2. Depression In remission, continue on Pristiq - Desvenlafaxine ER 100 MG TB24; Take 1 tablet (100 mg total) by mouth daily.  Dispense: 90 tablet; Refill: 1  3. Vitamin D insufficiency  - VITAMIN D 25 Hydroxy (Vit-D Deficiency, Fractures)  4. Dysphagia, unspecified type Suspect an esophageal disorder based on patient's presentation, referral for endoscopy - Ambulatory referral to Gastroenterology   Kathi LudwigSyed Asad A. Faylene KurtzShah [facility] 06/14/2017 9:38 AM

## 2017-06-15 LAB — VITAMIN D 25 HYDROXY (VIT D DEFICIENCY, FRACTURES): VIT D 25 HYDROXY: 21 ng/mL — AB (ref 30–100)

## 2017-06-17 ENCOUNTER — Encounter: Payer: Self-pay | Admitting: Gastroenterology

## 2017-06-20 ENCOUNTER — Other Ambulatory Visit: Payer: Self-pay

## 2017-06-20 DIAGNOSIS — E559 Vitamin D deficiency, unspecified: Secondary | ICD-10-CM

## 2017-06-20 MED ORDER — VITAMIN D (ERGOCALCIFEROL) 1.25 MG (50000 UNIT) PO CAPS
50000.0000 [IU] | ORAL_CAPSULE | ORAL | 0 refills | Status: DC
Start: 2017-06-20 — End: 2017-09-16

## 2017-09-16 ENCOUNTER — Other Ambulatory Visit: Payer: Self-pay

## 2017-09-16 DIAGNOSIS — E559 Vitamin D deficiency, unspecified: Secondary | ICD-10-CM

## 2017-09-16 MED ORDER — VITAMIN D (ERGOCALCIFEROL) 1.25 MG (50000 UNIT) PO CAPS: 50000.0000 [IU] | ORAL_CAPSULE | ORAL | 0 refills | Status: DC

## 2017-09-16 NOTE — Telephone Encounter (Signed)
[facility] request for general medication: [facility]   Last office visit: 06/14/2017  Follow up on 12/13/2017

## 2017-11-06 LAB — HEPATIC FUNCTION PANEL
ALK PHOS: 93 (ref 25–125)
ALT: 42 — AB (ref 10–40)
AST: 39 (ref 14–40)

## 2017-11-06 LAB — BASIC METABOLIC PANEL
BUN: 8 (ref 4–21)
Creatinine: 0.8 (ref 0.6–1.3)
Glucose: 92
Potassium: 4.2 (ref 3.4–5.3)
SODIUM: 140 (ref 137–147)

## 2017-11-06 LAB — HEMOGLOBIN A1C: Hemoglobin A1C: 4.9

## 2017-11-06 LAB — LIPID PANEL
CHOLESTEROL: 226 — AB (ref 0–200)
HDL: 54 (ref 35–70)
LDL Cholesterol: 103
TRIGLYCERIDES: 344 — AB (ref 40–160)

## 2017-11-06 LAB — CBC AND DIFFERENTIAL
HCT: 46 (ref 41–53)
HEMOGLOBIN: 15.9 (ref 13.5–17.5)
PLATELETS: 147 — AB (ref 150–399)
WBC: 5.1

## 2017-11-06 LAB — VITAMIN D 25 HYDROXY (VIT D DEFICIENCY, FRACTURES): Vit D, 25-Hydroxy: 24.8

## 2017-11-06 LAB — TSH: TSH: 4.07 (ref 0.41–5.90)

## 2017-11-07 ENCOUNTER — Encounter: Payer: Self-pay | Admitting: Family Medicine

## 2017-11-12 ENCOUNTER — Encounter: Payer: Self-pay | Admitting: General Practice

## 2017-11-20 DIAGNOSIS — D485 Neoplasm of uncertain behavior of skin: Secondary | ICD-10-CM | POA: Diagnosis not present

## 2017-11-20 DIAGNOSIS — Z1283 Encounter for screening for malignant neoplasm of skin: Secondary | ICD-10-CM | POA: Diagnosis not present

## 2017-11-20 DIAGNOSIS — L918 Other hypertrophic disorders of the skin: Secondary | ICD-10-CM | POA: Diagnosis not present

## 2017-11-20 DIAGNOSIS — D2262 Melanocytic nevi of left upper limb, including shoulder: Secondary | ICD-10-CM | POA: Diagnosis not present

## 2017-11-20 DIAGNOSIS — L400 Psoriasis vulgaris: Secondary | ICD-10-CM | POA: Diagnosis not present

## 2017-11-20 DIAGNOSIS — D225 Melanocytic nevi of trunk: Secondary | ICD-10-CM | POA: Diagnosis not present

## 2017-12-11 DIAGNOSIS — D485 Neoplasm of uncertain behavior of skin: Secondary | ICD-10-CM | POA: Diagnosis not present

## 2017-12-11 DIAGNOSIS — D225 Melanocytic nevi of trunk: Secondary | ICD-10-CM | POA: Diagnosis not present

## 2017-12-11 DIAGNOSIS — L400 Psoriasis vulgaris: Secondary | ICD-10-CM | POA: Diagnosis not present

## 2017-12-11 DIAGNOSIS — L821 Other seborrheic keratosis: Secondary | ICD-10-CM | POA: Diagnosis not present

## 2017-12-13 ENCOUNTER — Ambulatory Visit (INDEPENDENT_AMBULATORY_CARE_PROVIDER_SITE_OTHER): Payer: BLUE CROSS/BLUE SHIELD | Admitting: Family Medicine

## 2017-12-13 ENCOUNTER — Encounter: Payer: Self-pay | Admitting: Family Medicine

## 2017-12-13 VITALS — BP 138/86 | HR 92 | Temp 98.1°F | Resp 16 | Ht 71.0 in | Wt 193.3 lb

## 2017-12-13 DIAGNOSIS — E781 Pure hyperglyceridemia: Secondary | ICD-10-CM

## 2017-12-13 DIAGNOSIS — F330 Major depressive disorder, recurrent, mild: Secondary | ICD-10-CM

## 2017-12-13 DIAGNOSIS — G4709 Other insomnia: Secondary | ICD-10-CM | POA: Diagnosis not present

## 2017-12-13 DIAGNOSIS — J302 Other seasonal allergic rhinitis: Secondary | ICD-10-CM

## 2017-12-13 DIAGNOSIS — F1020 Alcohol dependence, uncomplicated: Secondary | ICD-10-CM | POA: Diagnosis not present

## 2017-12-13 DIAGNOSIS — F3342 Major depressive disorder, recurrent, in full remission: Secondary | ICD-10-CM | POA: Diagnosis not present

## 2017-12-13 DIAGNOSIS — D696 Thrombocytopenia, unspecified: Secondary | ICD-10-CM | POA: Diagnosis not present

## 2017-12-13 DIAGNOSIS — L409 Psoriasis, unspecified: Secondary | ICD-10-CM | POA: Diagnosis not present

## 2017-12-13 DIAGNOSIS — I10 Essential (primary) hypertension: Secondary | ICD-10-CM | POA: Diagnosis not present

## 2017-12-13 DIAGNOSIS — E559 Vitamin D deficiency, unspecified: Secondary | ICD-10-CM | POA: Diagnosis not present

## 2017-12-13 DIAGNOSIS — J3089 Other allergic rhinitis: Secondary | ICD-10-CM

## 2017-12-13 MED ORDER — TRAZODONE HCL 50 MG PO TABS: 50.0000 mg | ORAL_TABLET | Freq: Every evening | ORAL | 0 refills | Status: DC | PRN

## 2017-12-13 MED ORDER — VITAMIN D (ERGOCALCIFEROL) 1.25 MG (50000 UNIT) PO CAPS: 50000.0000 [IU] | ORAL_CAPSULE | ORAL | 0 refills | Status: DC

## 2017-12-13 MED ORDER — ATENOLOL 50 MG PO TABS: 50.0000 mg | ORAL_TABLET | Freq: Every day | ORAL | 1 refills | Status: DC

## 2017-12-13 MED ORDER — DESVENLAFAXINE ER 100 MG PO TB24: 1.0000 | ORAL_TABLET | Freq: Every day | ORAL | 1 refills | Status: DC

## 2017-12-13 NOTE — Progress Notes (Signed)
Name: Danny Chase   MRN: [id number]    DOB: 12-08-1975   Date:12/13/2017       Progress Note  Subjective  Chief Complaint  Chief Complaint  Patient presents with  . Medication Refill  . Hypertension    Denies any symptoms  . Allergic Rhinitis   . Depression    HPI  Alcohol use: AUDIT positive, took medication to help him quit in the past but stopped on his own, discussed AA meetings but he said no. Heavy drinker for about 20 years. He is functional, no DUI. Drinks at home after work.   Mild Major Depression: sees Dr. Evelene Croon in [place] but not frequently. Denies suicidal thoughts and or ideation. He is compliant with medications , has problems sleeping occasionally and takes prn Trazodone.   Vitamin D deficiency: taking rx vitamin D daily   HTN: taking Atenolol daily and bp has been under control, denies chest pain or palpitation. We will get EKG   Psoriasis: had a flare recently, seen by Dermatologist using steroid foam and is improving now.   Hypertriglyceridemia: discussed importance of quitting drinking, eating healthy and exercises   Patient Active Problem List   Diagnosis Date Noted  . Perennial allergic rhinitis with seasonal variation 12/13/2017  . Vitamin D deficiency 12/13/2017  . Alcohol use disorder, moderate, dependence (HCC) 12/13/2017  . Psoriasis 12/13/2017  . Mild recurrent major depression (HCC) 07/13/2015  . Insomnia 07/13/2015  . HTN (hypertension) 07/13/2015    History reviewed. No pertinent surgical history.  Family History  Problem Relation Age of Onset  . Cancer Mother        Lymphoma  . Stroke Mother   . Cancer Father        Lymphoma, Colon CA  . Hypertension Brother     Social History   Socioeconomic History  . Marital status: Single    Spouse name: Not on file  . Number of children: Not on file  . Years of education: Not on file  . Highest education level: Not on file  Occupational History  . Not on file  Social Needs  .  Financial resource strain: Not on file  . Food insecurity:    Worry: Not on file    Inability: Not on file  . Transportation needs:    Medical: Not on file    Non-medical: Not on file  Tobacco Use  . Smoking status: Never Smoker  . Smokeless tobacco: Never Used  Substance and Sexual Activity  . Alcohol use: Yes    Alcohol/week: 2.4 oz    Types: 4 Glasses of wine per week    Comment: sometimes liquor   . Drug use: No  . Sexual activity: Yes  Lifestyle  . Physical activity:    Days per week: Not on file    Minutes per session: Not on file  . Stress: Not on file  Relationships  . Social connections:    Talks on phone: Not on file    Gets together: Not on file    Attends religious service: Not on file    Active member of club or organization: Not on file    Attends meetings of clubs or organizations: Not on file    Relationship status: Not on file  . Intimate partner violence:    Fear of current or ex partner: Not on file    Emotionally abused: Not on file    Physically abused: Not on file    Forced sexual activity: Not  on file  Other Topics Concern  . Not on file  Social History Narrative  . Not on file     Current Outpatient Medications:  .  atenolol (TENORMIN) 50 MG tablet, Take 1 tablet (50 mg total) by mouth daily., Disp: 90 tablet, Rfl: 1 .  cetirizine (ZYRTEC) 10 MG tablet, Take 10 mg by mouth at bedtime., Disp: , Rfl:  .  Desvenlafaxine ER 100 MG TB24, Take 1 tablet (100 mg total) by mouth daily., Disp: 90 tablet, Rfl: 1 .  fluticasone (FLONASE) 50 MCG/ACT nasal spray, Place 2 sprays into both nostrils daily., Disp: , Rfl: 4 .  HALOBETASOL PROPIONATE 0.05 % FOAM, APPLY TO psoriasis ON ARMS, LEGS, trunk 5 DAYS A WEEK, apply moisturizer AFTER applying foam, Disp: , Rfl: 3 .  traZODone (DESYREL) 50 MG tablet, Take 1 tablet (50 mg total) by mouth at bedtime as needed for sleep., Disp: 30 tablet, Rfl: 0 .  Vitamin D, Ergocalciferol, (DRISDOL) 50000 units CAPS capsule,  Take 1 capsule (50,000 Units total) by mouth once a week. For 12 weeks, Disp: 12 capsule, Rfl: 0  No Known Allergies   ROS  Constitutional: Negative for fever or weight change.  Respiratory: Negative for cough and shortness of breath.   Cardiovascular: Negative for chest pain or palpitations.  Gastrointestinal: Negative for abdominal pain, no bowel changes.  Musculoskeletal: Negative for gait problem or joint swelling.  Skin: Negative for rash.  Neurological: Negative for dizziness or headache.  No other specific complaints in a complete review of systems (except as listed in HPI above).  Objective  Vitals:   12/13/17 0856  BP: 138/86  Pulse: 92  Resp: 16  Temp: 98.1 F (36.7 C)  TempSrc: Oral  SpO2: 95%  Weight: 193 lb 4.8 oz (87.7 kg)  Height: 5\' 11"  (1.803 m)    Body mass index is 26.96 kg/m.  Physical Exam  Constitutional: Patient appears well-developed and well-nourished. Overweight.  No distress.  HEENT: head atraumatic, normocephalic, pupils equal and reactive to light,  neck supple, throat within normal limits Cardiovascular: Normal rate, regular rhythm and normal heart sounds.  No murmur heard. No BLE edema. Pulmonary/Chest: Effort normal and breath sounds normal. No respiratory distress. Abdominal: Soft.  There is no tenderness. Psychiatric: Patient has a normal mood and affect. behavior is normal. Judgment and thought content normal.  Recent Results (from the past 2160 hour(s))  CBC and differential     Status: Abnormal   Collection Time: 11/06/17 12:00 AM  Result Value Ref Range   Hemoglobin 15.9 13.5 - 17.5   HCT 46 41 - 53   Platelets 147 (A) 150 - 399   WBC 5.1   VITAMIN D 25 Hydroxy (Vit-D Deficiency, Fractures)     Status: None   Collection Time: 11/06/17 12:00 AM  Result Value Ref Range   Vit D, 25-Hydroxy 24.8   Basic metabolic panel     Status: None   Collection Time: 11/06/17 12:00 AM  Result Value Ref Range   Glucose 92    BUN 8 4 - 21    Creatinine 0.8 0.6 - 1.3   Potassium 4.2 3.4 - 5.3   Sodium 140 137 - 147  Lipid panel     Status: Abnormal   Collection Time: 11/06/17 12:00 AM  Result Value Ref Range   Triglycerides 344 (A) 40 - 160   Cholesterol 226 (A) 0 - 200   HDL 54 35 - 70   LDL Cholesterol 103  Hepatic function panel     Status: Abnormal   Collection Time: 11/06/17 12:00 AM  Result Value Ref Range   Alkaline Phosphatase 93 25 - 125   ALT 42 (A) 10 - 40   AST 39 14 - 40  Hemoglobin A1c     Status: None   Collection Time: 11/06/17 12:00 AM  Result Value Ref Range   Hemoglobin A1C 4.9   TSH     Status: None   Collection Time: 11/06/17 12:00 AM  Result Value Ref Range   TSH 4.07 0.41 - 5.90      [facility] Visit from 12/13/2017 in [facility]  AUDIT-C Score  7       PHQ2/9: Depression screen [facility] 2/9 12/13/2017 06/14/2017 12/13/2016 06/13/2016 12/05/2015  Decreased Interest 0 0 0 0 0  Down, Depressed, Hopeless 0 0 0 0 0  PHQ - 2 Score 0 0 0 0 0  Altered sleeping 0 - - - -  Tired, decreased energy 0 - - - -  Change in appetite 0 - - - -  Feeling bad or failure about yourself  0 - - - -  Trouble concentrating 0 - - - -  Moving slowly or fidgety/restless 0 - - - -  Suicidal thoughts 0 - - - -  PHQ-9 Score 0 - - - -  Difficult doing work/chores Not difficult at all - - - -   He states goes to psychiatrist, and did not want to answer it correctly   Fall Risk: Fall Risk  12/13/2017 06/14/2017 12/13/2016 06/13/2016 12/05/2015  Falls in the past year? No No No No No    Functional Status Survey: Is the patient deaf or have difficulty hearing?: No Does the patient have difficulty seeing, even when wearing glasses/contacts?: Yes Does the patient have difficulty concentrating, remembering, or making decisions?: No Does the patient have difficulty walking or climbing stairs?: No Does the patient have difficulty dressing or bathing?: No Does the patient have difficulty doing errands  alone such as visiting a doctor's office or shopping?: No    Assessment & Plan   1. Mild recurrent major depression (HCC)  Continue follow up with psychiatrist. I sent rx to pharmacy but we will call and cancel it   2. Perennial allergic rhinitis with seasonal variation  stable  3. Vitamin D deficiency  - Vitamin D, Ergocalciferol, (DRISDOL) 50000 units CAPS capsule; Take 1 capsule (50,000 Units total) by mouth once a week. For 12 weeks  Dispense: 12 capsule; Refill: 0  4. Alcohol use disorder, moderate, dependence (HCC)  He was given medication to quit drinking by Dr. Evelene Croon in the past, he will discuss it again with Dr. Evelene Croon   5. Essential hypertension  - atenolol (TENORMIN) 50 MG tablet; Take 1 tablet (50 mg total) by mouth daily.  Dispense: 90 tablet; Refill: 1 Normal EKG  6. Other insomnia  - traZODone (DESYREL) 50 MG tablet; Take 1 tablet (50 mg total) by mouth at bedtime as needed for sleep.  Dispense: 30 tablet; Refill: 0  7. Thrombocytopenia (HCC)  Explained likely from alcohol use, also elevated GGT   8. Hypertriglyceridemia  Discussed life style modification   9. Psoriasis  Seen by Dr. Laurena Bering and resumed medication at this time

## 2018-04-22 ENCOUNTER — Other Ambulatory Visit: Payer: Self-pay | Admitting: Family Medicine

## 2018-04-22 DIAGNOSIS — E559 Vitamin D deficiency, unspecified: Secondary | ICD-10-CM

## 2018-06-23 ENCOUNTER — Ambulatory Visit: Payer: BLUE CROSS/BLUE SHIELD | Admitting: Family Medicine

## 2018-06-23 ENCOUNTER — Encounter: Payer: Self-pay | Admitting: Family Medicine

## 2018-06-23 VITALS — BP 126/86 | HR 116 | Temp 97.8°F | Resp 16 | Ht 71.0 in | Wt 201.2 lb

## 2018-06-23 DIAGNOSIS — E559 Vitamin D deficiency, unspecified: Secondary | ICD-10-CM

## 2018-06-23 DIAGNOSIS — I10 Essential (primary) hypertension: Secondary | ICD-10-CM

## 2018-06-23 DIAGNOSIS — E781 Pure hyperglyceridemia: Secondary | ICD-10-CM

## 2018-06-23 DIAGNOSIS — F1020 Alcohol dependence, uncomplicated: Secondary | ICD-10-CM | POA: Diagnosis not present

## 2018-06-23 DIAGNOSIS — D696 Thrombocytopenia, unspecified: Secondary | ICD-10-CM | POA: Diagnosis not present

## 2018-06-23 DIAGNOSIS — F330 Major depressive disorder, recurrent, mild: Secondary | ICD-10-CM

## 2018-06-23 DIAGNOSIS — L409 Psoriasis, unspecified: Secondary | ICD-10-CM

## 2018-06-23 NOTE — Progress Notes (Signed)
Name: Danny Chase   MRN: [id number]    DOB: [birth year]   Date:06/23/2018       Progress Note  Subjective  Chief Complaint  Chief Complaint  Patient presents with  . Medication Refill    6 month F/U  . Depression  . Hypertension  . Psoriasis  . Hyperlipidemia    HPI  Alcohol use: AUDIT positive, took medication to help him quit in the past but stopped on his own, discussed AA meetings but he said no. Heavy drinker for about 20 years, he is now drinking one bottle of wine per day, but wants to cut down and be healthier. He is functional, no DUI. Drinks at home after work.   Mild Major Depression: sees Dr. [place]aur [place] but not frequently. Denies suicidal thoughts and or ideation. He is compliant with medications , has problems sleeping occasionally and takes prn Trazodone. He is motivated now to use his work gym, started yesterday, wants to lose weight and be healthier, also states wants to socialize again .  Vitamin D deficiency: taking rx vitamin D daily   HTN: taking Atenolol daily and bp has been under control, denies chest pain or palpitation. HR up today, but improved before he left.   Psoriasis: had a flare this past Summer but is doing well now  Hypertriglyceridemia: discussed importance of quitting drinking, eating healthy and exercises, he is changing his diet and we will recheck labs today   Thrombocytopenia: we will recheck labs it may be secondary to alcohol abuse  Patient Active Problem List   Diagnosis Date Noted  . Thrombocytopenia (HCC) 06/23/2018  . Perennial allergic rhinitis with seasonal variation 12/13/2017  . Vitamin D deficiency 12/13/2017  . Alcohol use disorder, moderate, dependence (HCC) 12/13/2017  . Psoriasis 12/13/2017  . Mild recurrent major depression (HCC) 07/13/2015  . Insomnia 07/13/2015  . HTN (hypertension) 07/13/2015    History reviewed. No pertinent surgical history.  Family History  Problem Relation Age of Onset  .  Cancer Mother        Lymphoma  . Stroke Mother   . Cancer Father        Lymphoma, Colon CA  . Hypertension Brother     Social History   Socioeconomic History  . Marital status: Single    Spouse name: Not on file  . Number of children: 1  . Years of education: Not on file  . Highest education level: Bachelor's degree (e.g., BA, AB, BS)  Occupational History  . Occupation: Charity fundraiserweb developer     Comment[place] [employer] [employer]   Social Needs  . Financial resource strain: Not hard at all  . Food insecurity:    Worry: Never true    Inability: Never true  . Transportation needs:    Medical: No    Non-medical: No  Tobacco Use  . Smoking status: Never Smoker  . Smokeless tobacco: Never Used  Substance and Sexual Activity  . Alcohol use: Yes    Alcohol/week: 4.0 standard drinks    Types: 4 Glasses of wine per week  . Drug use: No  . Sexual activity: Yes  Lifestyle  . Physical activity:    Days per week: 3 days    Minutes per session: 60 min  . Stress: Very much  Relationships  . Social connections:    Talks on phone: Twice a week    Gets together: Twice a week    Attends religious service: Never    Active member of club  or organization: No    Attends meetings of clubs or organizations: Never    Relationship status: Never married  . Intimate partner violence:    Fear of current or ex partner: No    Emotionally abused: No    Physically abused: No    Forced sexual activity: No  Other Topics Concern  . Not on file  Social History Narrative   He lives alone, has a son, sees his son every weekend also brother and parents weekly   He plays guitar      Current Outpatient Medications:  .  atenolol (TENORMIN) 50 MG tablet, Take 1 tablet (50 mg total) by mouth daily., Disp: 90 tablet, Rfl: 1 .  cetirizine (ZYRTEC) 10 MG tablet, Take 10 mg by mouth at bedtime., Disp: , Rfl:  .  Desvenlafaxine ER 100 MG TB24, Take 1 tablet (100 mg total) by mouth daily., Disp: 90 tablet, Rfl: 1 .   fluticasone (FLONASE) 50 MCG/ACT nasal spray, Place 2 sprays into both nostrils daily., Disp: , Rfl: 4 .  traZODone (DESYREL) 50 MG tablet, Take 1 tablet (50 mg total) by mouth at bedtime as needed for sleep., Disp: 30 tablet, Rfl: 0 .  Vitamin D, Ergocalciferol, (DRISDOL) 50000 units CAPS capsule, TAKE 1 CAPSULE (50,000 UNITS TOTAL) BY MOUTH ONCE A WEEK. FOR 12 WEEKS, Disp: 12 capsule, Rfl: 0 .  HALOBETASOL PROPIONATE 0.05 % FOAM, APPLY TO psoriasis ON ARMS, LEGS, trunk 5 DAYS A WEEK, apply moisturizer AFTER applying foam, Disp: , Rfl: 3  No Known Allergies  I personally reviewed active problem list, medication list, allergies, family history, social history with the patient/caregiver today.   ROS  Constitutional: Negative for fever or weight change.  Respiratory: Negative for cough and shortness of breath.   Cardiovascular: Negative for chest pain or palpitations.  Gastrointestinal: Negative for abdominal pain, no bowel changes.  Musculoskeletal: Negative for gait problem or joint swelling.  Skin: Negative for rash.  Neurological: Negative for dizziness or headache.  No other specific complaints in a complete review of systems (except as listed in HPI above).   Objective  Vitals:   06/23/18 0908  BP: 126/86  Pulse: (!) 116  Resp: 16  Temp: 97.8 F (36.6 C)  TempSrc: Oral  SpO2: 98%  Weight: 201 lb 3.2 oz (91.3 kg)  Height: 5\' 11"  (1.803 m)    Body mass index is 28.06 kg/m.  Physical Exam  Constitutional: Patient appears well-developed and well-nourished. Overweight.  No distress.  HEENT: head atraumatic, normocephalic, pupils equal and reactive to light, neck supple, throat within normal limits Cardiovascular: Normal rate, regular rhythm and normal heart sounds.  No murmur heard. No BLE edema. Pulmonary/Chest: Effort normal and breath sounds normal. No respiratory distress. Abdominal: Soft.  There is no tenderness. Psychiatric: Patient has a normal mood and affect.  behavior is normal. Judgment and thought content normal.   PHQ2/9: Depression screen [facility] 2/9 06/23/2018 12/13/2017 06/14/2017 12/13/2016 06/13/2016  Decreased Interest 0 0 0 0 0  Down, Depressed, Hopeless 0 0 0 0 0  PHQ - 2 Score 0 0 0 0 0  Altered sleeping 0 0 - - -  Tired, decreased energy 0 0 - - -  Change in appetite 0 0 - - -  Feeling bad or failure about yourself  0 0 - - -  Trouble concentrating 0 0 - - -  Moving slowly or fidgety/restless 0 0 - - -  Suicidal thoughts 0 0 - - -  PHQ-9  Score 0 0 - - -  Difficult doing work/chores Not difficult at all Not difficult at all - - -     Fall Risk: Fall Risk  06/23/2018 12/13/2017 06/14/2017 12/13/2016 06/13/2016  Falls in the past year? 0 No No No No  Number falls in past yr: 0 - - - -  Injury with Fall? 0 - - - -     Functional Status Survey: Is the patient deaf or have difficulty hearing?: No Does the patient have difficulty seeing, even when wearing glasses/contacts?: Yes Does the patient have difficulty concentrating, remembering, or making decisions?: No Does the patient have difficulty walking or climbing stairs?: No Does the patient have difficulty dressing or bathing?: No Does the patient have difficulty doing errands alone such as visiting a doctor's office or shopping?: No    Assessment & Plan  1. Mild recurrent major depression (HCC)  Under the care of Dr. Evelene Croon.   2. Alcohol use disorder, moderate, dependence (HCC)  Discussed importance of at least cutting down on medication   3. Thrombocytopenia (HCC)  - CBC with Differential/Platelet  4. Vitamin D insufficiency  Last level was normal   5. Psoriasis  Doing well at this time  6. Hypertriglyceridemia  - Lipid panel  7. Essential hypertension  At goal  - BASIC METABOLIC PANEL WITH GFR - CBC with Differential/Platelet

## 2018-06-24 ENCOUNTER — Other Ambulatory Visit: Payer: Self-pay | Admitting: Family Medicine

## 2018-06-24 MED ORDER — ICOSAPENT ETHYL 1 G PO CAPS: 2.0000 g | ORAL_CAPSULE | Freq: Two times a day (BID) | ORAL | 1 refills | Status: DC

## 2018-06-24 NOTE — Progress Notes (Unsigned)
va  Prior Auth. Denied. Patient must try one of the following products: Niacin immediate-release or extended release, or a fibrate (for example gemifibrozil, fenofibrate of fenofibric acid or a statin.  His triglycerides is above 700 and risk of pancreatitis, other medications will not get it to goal. Can we try a physician call with them? More information to be sent?

## 2018-06-25 ENCOUNTER — Other Ambulatory Visit: Payer: Self-pay | Admitting: Family Medicine

## 2018-06-25 LAB — TEST AUTHORIZATION

## 2018-06-25 LAB — BASIC METABOLIC PANEL WITH GFR
BUN: 9 mg/dL (ref 7–25)
CO2: 25 mmol/L (ref 20–32)
Calcium: 9.2 mg/dL (ref 8.6–10.3)
Chloride: 103 mmol/L (ref 98–110)
Creat: 0.73 mg/dL (ref 0.60–1.35)
GFR, EST AFRICAN AMERICAN: 131 mL/min/{1.73_m2} (ref >=60)
GFR, EST NON AFRICAN AMERICAN: 113 mL/min/{1.73_m2} (ref >=60)
Glucose, Bld: 83 mg/dL (ref 65–99)
POTASSIUM: 4.1 mmol/L (ref 3.5–5.3)
SODIUM: 140 mmol/L (ref 135–146)

## 2018-06-25 LAB — CBC WITH DIFFERENTIAL/PLATELET
ABSOLUTE MONOCYTES: 262 {cells}/uL (ref 200–950)
BASOS ABS: 29 {cells}/uL (ref 0–200)
BASOS PCT: 0.7 %
EOS ABS: 131 {cells}/uL (ref 15–500)
Eosinophils Relative: 3.2 %
HEMATOCRIT: 47 % (ref 38.5–50.0)
HEMOGLOBIN: 16.4 g/dL (ref 13.2–17.1)
LYMPHS ABS: 1710 {cells}/uL (ref 850–3900)
MCH: 32.4 pg (ref 27.0–33.0)
MCHC: 34.9 g/dL (ref 32.0–36.0)
MCV: 92.9 fL (ref 80.0–100.0)
MONOS PCT: 6.4 %
MPV: 10.1 fL (ref 7.5–12.5)
NEUTROS ABS: 1968 {cells}/uL (ref 1500–7800)
Neutrophils Relative %: 48 %
Platelets: 179 10*3/uL (ref 140–400)
RBC: 5.06 10*6/uL (ref 4.20–5.80)
RDW: 13.2 % (ref 11.0–15.0)
Total Lymphocyte: 41.7 %
WBC: 4.1 10*3/uL (ref 3.8–10.8)

## 2018-06-25 LAB — LIPID PANEL
Cholesterol: 245 mg/dL — ABNORMAL HIGH (ref <200)
HDL: 56 mg/dL (ref >40)
Non-HDL Cholesterol (Calc): 189 mg/dL (calc) — ABNORMAL HIGH (ref <130)
Total CHOL/HDL Ratio: 4.4 (calc) (ref <5.0)
Triglycerides: 748 mg/dL — ABNORMAL HIGH (ref <150)

## 2018-06-25 LAB — ALBUMIN: Albumin: 4.1 g/dL (ref 3.6–5.1)

## 2018-06-25 LAB — PROTEIN, TOTAL: Total Protein: 7.4 g/dL (ref 6.1–8.1)

## 2018-06-25 LAB — ALT: ALT: 49 U/L — AB (ref 9–46)

## 2018-06-25 LAB — AST: AST: 43 U/L — AB (ref 10–40)

## 2018-06-25 LAB — ALKALINE PHOSPHATASE: Alkaline phosphatase (APISO): 87 U/L (ref 40–115)

## 2018-06-25 LAB — BILIRUBIN, TOTAL: Total Bilirubin: 0.2 mg/dL (ref 0.2–1.2)

## 2018-07-09 DIAGNOSIS — D485 Neoplasm of uncertain behavior of skin: Secondary | ICD-10-CM | POA: Diagnosis not present

## 2018-07-09 DIAGNOSIS — L578 Other skin changes due to chronic exposure to nonionizing radiation: Secondary | ICD-10-CM | POA: Diagnosis not present

## 2018-07-09 DIAGNOSIS — D225 Melanocytic nevi of trunk: Secondary | ICD-10-CM | POA: Diagnosis not present

## 2018-07-09 DIAGNOSIS — Z1283 Encounter for screening for malignant neoplasm of skin: Secondary | ICD-10-CM | POA: Diagnosis not present

## 2018-07-09 DIAGNOSIS — D2230 Melanocytic nevi of unspecified part of face: Secondary | ICD-10-CM | POA: Diagnosis not present

## 2018-07-18 ENCOUNTER — Other Ambulatory Visit: Payer: Self-pay

## 2018-07-18 DIAGNOSIS — F330 Major depressive disorder, recurrent, mild: Secondary | ICD-10-CM

## 2018-07-18 NOTE — Telephone Encounter (Signed)
Refill request for general medication: Desvenlafaxine Succinate ER 100 mg  Last office visit: 06/23/2018  Last physical exam: 12/13/2016  Follow-ups on file. None indicated  The first second one is what was ordered in his medication list. But it was flagged because it is not covered by insurance, but the first one is.

## 2018-07-29 ENCOUNTER — Other Ambulatory Visit: Payer: Self-pay

## 2018-08-11 ENCOUNTER — Other Ambulatory Visit: Payer: Self-pay | Admitting: Family Medicine

## 2018-08-11 DIAGNOSIS — E559 Vitamin D deficiency, unspecified: Secondary | ICD-10-CM

## 2018-08-18 ENCOUNTER — Other Ambulatory Visit: Payer: Self-pay | Admitting: Family Medicine

## 2018-08-18 DIAGNOSIS — I10 Essential (primary) hypertension: Secondary | ICD-10-CM

## 2018-09-02 ENCOUNTER — Other Ambulatory Visit: Payer: Self-pay

## 2018-09-02 DIAGNOSIS — F330 Major depressive disorder, recurrent, mild: Secondary | ICD-10-CM

## 2018-09-02 NOTE — Telephone Encounter (Signed)
Refill request for general medication. [facility]    Last office visit 06/23/2018   Follow up on 12/23/2018

## 2018-09-03 ENCOUNTER — Other Ambulatory Visit: Payer: Self-pay

## 2018-09-03 NOTE — Telephone Encounter (Signed)
Refill request for general medication: Prixtiq 100 mg  Last office visit: 06/23/2018  Follow-ups on file. 12/23/2018

## 2018-09-03 NOTE — Telephone Encounter (Signed)
Please call Dr. Carie Caddy office, he sees her but pharmacy is requesting refills from me. Please verify

## 2018-09-04 NOTE — Telephone Encounter (Signed)
Dr. Rupinder Kaur 

## 2018-09-04 NOTE — Telephone Encounter (Signed)
Who is Dr. Evelene Croon. What is the name of his office.

## 2018-09-05 MED ORDER — DESVENLAFAXINE SUCCINATE ER 100 MG PO TB24: 100.0000 mg | ORAL_TABLET | Freq: Every day | ORAL | 0 refills | Status: DC

## 2018-09-05 NOTE — Telephone Encounter (Signed)
Patient called stating that since he missed an appointment with Dr. Evelene Croon he cannot get this medication refilled through their office until he is seen. He is requesting a short term supply of this until he is able to get back in their office (in about a month). He states that he is willing to make appointment with Dr. Carlynn Purl, if needed, but he is out of this medication. Please advise.

## 2018-09-27 ENCOUNTER — Other Ambulatory Visit: Payer: Self-pay | Admitting: Family Medicine

## 2018-09-29 ENCOUNTER — Encounter: Payer: Self-pay | Admitting: Family Medicine

## 2018-09-29 ENCOUNTER — Other Ambulatory Visit: Payer: Self-pay

## 2018-09-29 ENCOUNTER — Ambulatory Visit (INDEPENDENT_AMBULATORY_CARE_PROVIDER_SITE_OTHER): Payer: BLUE CROSS/BLUE SHIELD | Admitting: Family Medicine

## 2018-09-29 VITALS — BP 133/93 | HR 82 | Temp 98.3°F | Ht 71.0 in | Wt 195.0 lb

## 2018-09-29 DIAGNOSIS — F330 Major depressive disorder, recurrent, mild: Secondary | ICD-10-CM | POA: Diagnosis not present

## 2018-09-29 DIAGNOSIS — I10 Essential (primary) hypertension: Secondary | ICD-10-CM

## 2018-09-29 DIAGNOSIS — F1010 Alcohol abuse, uncomplicated: Secondary | ICD-10-CM

## 2018-09-29 DIAGNOSIS — E781 Pure hyperglyceridemia: Secondary | ICD-10-CM

## 2018-09-29 MED ORDER — DESVENLAFAXINE SUCCINATE ER 100 MG PO TB24: 100.0000 mg | ORAL_TABLET | Freq: Every day | ORAL | 1 refills | Status: DC

## 2018-09-29 MED ORDER — ATENOLOL 50 MG PO TABS: 25.0000 mg | ORAL_TABLET | Freq: Two times a day (BID) | ORAL | Status: DC

## 2018-09-29 NOTE — Telephone Encounter (Signed)
Pt scheduled virtual visit for this afternoon

## 2018-09-29 NOTE — Telephone Encounter (Signed)
Spoke with patient and he states there was a mixed up with his old psychiatrist. He has an appointment with the new one Psychiatrist in June 2020. He had a mixed up and states he will just need one more refill until they will take over responsibility since this will be a new psychiatrist.

## 2018-09-29 NOTE — Progress Notes (Signed)
Name: Danny Chase   MRN: [id number]    DOB: 11-May-1976   Date:09/29/2018       Progress Note  Subjective  Chief Complaint  Chief Complaint  Patient presents with  . Follow-up  . Depression  . Hypertension    Denies any symptoms-diastolic seems to stay elevated     I connected with  Ellis Parents  on 09/29/18 at  2:00 PM EDT by a video enabled telemedicine application and verified that I am speaking with the correct person using two identifiers.  I discussed the limitations of evaluation and management by telemedicine and the availability of in person appointments. The patient expressed understanding and agreed to proceed. Staff also discussed with the patient that there may be a patient responsible charge related to this service. Patient Location: at home  Provider Location: [facility]  HPI  Alcohol use: AUDIT positive, took medication to help him quit in the past but stopped on his own, discussed AA meetings but he said no. Heavy drinker for about 20 years, Today AUDIT was 7, discussed again importance of cutting down or quitting.   Mild Major Depression: sees Dr. Evelene Croon in [place] but not frequently, he missed last appointment and was re-scheduled form June 2020. He needs a refill of his medication to last until his follow up with her. Denies suicidal thoughts and or ideation. He is compliant with medications but has been taking only half pill lately because he felt it was making him feel sluggish lately. Long history of depression, denies suicidal thoughts or ideation   HTN: taking Atenolol daily and DBP at home has been elevated, He denies chest pain or palpitation.   Hypertriglyceridemia: discussed importance of quitting drinking, eating healthy and exercises, last labs showed triglycerides was above 700 and discuss risk of pancreatitis. He has been taking Vascepa but only two in am, advised to set alarm to remember taking evening dose.     Office Visit  from 09/29/2018 in [facility]  AUDIT-C Score  7      Patient Active Problem List   Diagnosis Date Noted  . Thrombocytopenia (HCC) 06/23/2018  . Perennial allergic rhinitis with seasonal variation 12/13/2017  . Vitamin D deficiency 12/13/2017  . Alcohol use disorder, moderate, dependence (HCC) 12/13/2017  . Psoriasis 12/13/2017  . Mild recurrent major depression (HCC) 07/13/2015  . Insomnia 07/13/2015  . HTN (hypertension) 07/13/2015    History reviewed. No pertinent surgical history.  Family History  Problem Relation Age of Onset  . Cancer Mother        Lymphoma  . Stroke Mother   . Cancer Father        Lymphoma, Colon CA  . Hypertension Brother     Social History   Socioeconomic History  . Marital status: Single    Spouse name: Not on file  . Number of children: 1  . Years of education: Not on file  . Highest education level: Bachelor's degree (e.g., BA, AB, BS)  Occupational History  . Occupation: Charity fundraiser     Comment: Licensed conveyancer   Social Needs  . Financial resource strain: Not hard at all  . Food insecurity:    Worry: Never true    Inability: Never true  . Transportation needs:    Medical: No    Non-medical: No  Tobacco Use  . Smoking status: Never Smoker  . Smokeless tobacco: Never Used  Substance and Sexual Activity  . Alcohol use: Yes  Alcohol/week: 4.0 standard drinks    Types: 4 Glasses of wine per week  . Drug use: No  . Sexual activity: Yes  Lifestyle  . Physical activity:    Days per week: 3 days    Minutes per session: 60 min  . Stress: Very much  Relationships  . Social connections:    Talks on phone: Twice a week    Gets together: Twice a week    Attends religious service: Never    Active member of club or organization: No    Attends meetings of clubs or organizations: Never    Relationship status: Never married  . Intimate partner violence:    Fear of current or ex partner: No    Emotionally abused: No     Physically abused: No    Forced sexual activity: No  Other Topics Concern  . Not on file  Social History Narrative   He lives alone, has a son, sees his son every weekend also brother and parents weekly   He plays guitar      Current Outpatient Medications:  .  atenolol (TENORMIN) 50 MG tablet, TAKE 1 TABLET BY MOUTH EVERY DAY, Disp: 90 tablet, Rfl: 1 .  cetirizine (ZYRTEC) 10 MG tablet, Take 10 mg by mouth at bedtime., Disp: , Rfl:  .  desvenlafaxine (PRISTIQ) 100 MG 24 hr tablet, Take 1 tablet (100 mg total) by mouth daily., Disp: 30 tablet, Rfl: 1 .  fluticasone (FLONASE) 50 MCG/ACT nasal spray, Place 2 sprays into both nostrils daily., Disp: , Rfl: 4 .  traZODone (DESYREL) 50 MG tablet, Take 1 tablet (50 mg total) by mouth at bedtime as needed for sleep., Disp: 30 tablet, Rfl: 0 .  Vitamin D, Ergocalciferol, (DRISDOL) 1.25 MG (50000 UT) CAPS capsule, TAKE 1 CAPSULE (50,000 UNITS TOTAL) BY MOUTH ONCE A WEEK. FOR 12 WEEKS, Disp: 12 capsule, Rfl: 0 .  HALOBETASOL PROPIONATE 0.05 % FOAM, APPLY TO psoriasis ON ARMS, LEGS, trunk 5 DAYS A WEEK, apply moisturizer AFTER applying foam, Disp: , Rfl: 3 .  Icosapent Ethyl (VASCEPA) 1 g CAPS, Take 2 capsules (2 g total) by mouth 2 (two) times daily. (Patient not taking: Reported on 09/29/2018), Disp: 360 capsule, Rfl: 1  No Known Allergies  I personally reviewed active problem list, medication list, allergies, family history with the patient/caregiver today.   ROS  Constitutional: Negative for fever or weight change.  Respiratory: Negative for cough and shortness of breath.   Cardiovascular: Negative for chest pain or palpitations.  Gastrointestinal: Negative for abdominal pain, no bowel changes.  Musculoskeletal: Negative for gait problem or joint swelling.  Skin: Negative for rash.  Neurological: Negative for dizziness or headache.  No other specific complaints in a complete review of systems (except as listed in HPI above).  Objective   Virtual encounter, vitals not obtained.  Body mass index is 27.2 kg/m.  Physical Exam  Awake, alert and oriented, no distress   PHQ2/9: [facility] screen [facility] 2/9 09/29/2018 06/23/2018 12/13/2017 06/14/2017 12/13/2016  Decreased Interest 0 0 0 0 0  Down, Depressed, Hopeless 0 0 0 0 0  PHQ - 2 Score 0 0 0 0 0  Altered sleeping 3 0 0 - -  Tired, decreased energy 0 0 0 - -  Change in appetite 0 0 0 - -  Feeling bad or failure about yourself  0 0 0 - -  Trouble concentrating 0 0 0 - -  Moving slowly or fidgety/restless 0 0 0 - -  Suicidal thoughts 0 0 0 - -  PHQ-9 Score 3 0 0 - -  Difficult doing work/chores Not difficult at all Not difficult at all Not difficult at all - -   PHQ-2/9 Result is positive.    Fall Risk: Fall Risk  09/29/2018 06/23/2018 12/13/2017 06/14/2017 12/13/2016  Falls in the past year? 0 0 No No No  Number falls in past yr: 0 0 - - -  Injury with Fall? 0 0 - - -     Assessment & Plan  1. Mild recurrent major depression (HCC)  - desvenlafaxine (PRISTIQ) 100 MG 24 hr tablet; Take 1 tablet (100 mg total) by mouth daily.  Dispense: 30 tablet; Refill: 1 Sending refill until his appointment with Dr. Evelene Croon in June  He states he has been cutting medication in half, but explained he needs to ask pharmacist to find out if he can do that. Explained I can send lower dose if needed.   2. Alcohol use disorder, mild abuse (HCC)  Discussed again the need to cut down on amount he drinks   3. Essential hypertension  BP elevated today at home, and when reviewing his monitor DBP has been elevated lately, we will adjust dose of Atenolol to one and a half daily   4. Hypertriglyceridemia  He has been taking Vascepa but only once a day, explained triglycerides was very high and needs to take it BID   I discussed the assessment and treatment plan with the patient. The patient was provided an opportunity to ask questions and all were answered. The patient agreed with the plan and  demonstrated an understanding of the instructions.  The patient was advised to call back or seek an in-person evaluation if the symptoms worsen or if the condition fails to improve as anticipated.  I provided 25  minutes of non-face-to-face time during this encounter.

## 2018-10-29 ENCOUNTER — Other Ambulatory Visit: Payer: Self-pay | Admitting: Family Medicine

## 2018-10-29 DIAGNOSIS — F330 Major depressive disorder, recurrent, mild: Secondary | ICD-10-CM

## 2018-10-29 DIAGNOSIS — E559 Vitamin D deficiency, unspecified: Secondary | ICD-10-CM

## 2018-10-29 NOTE — Telephone Encounter (Signed)
Refill request for general medication: Pristiq 100 mg  Last office visit: 09/29/2018   Follow-ups on file. 12/23/2018

## 2018-11-27 ENCOUNTER — Other Ambulatory Visit: Payer: Self-pay | Admitting: Family Medicine

## 2018-11-27 DIAGNOSIS — F330 Major depressive disorder, recurrent, mild: Secondary | ICD-10-CM

## 2018-11-27 NOTE — Telephone Encounter (Signed)
Called patient. LVM in regards to refill on Pristiq and follow up with Psychiatrist.

## 2018-11-28 NOTE — Telephone Encounter (Signed)
I have tried to reach out to him for 2 days. LVM.

## 2018-12-11 DIAGNOSIS — F3342 Major depressive disorder, recurrent, in full remission: Secondary | ICD-10-CM | POA: Diagnosis not present

## 2018-12-23 ENCOUNTER — Encounter: Payer: Self-pay | Admitting: Family Medicine

## 2018-12-23 ENCOUNTER — Ambulatory Visit (INDEPENDENT_AMBULATORY_CARE_PROVIDER_SITE_OTHER): Payer: BC Managed Care – PPO | Admitting: Family Medicine

## 2018-12-23 ENCOUNTER — Other Ambulatory Visit: Payer: Self-pay

## 2018-12-23 VITALS — BP 140/100 | HR 78 | Temp 97.1°F | Resp 16 | Ht 71.0 in | Wt 202.5 lb

## 2018-12-23 DIAGNOSIS — F330 Major depressive disorder, recurrent, mild: Secondary | ICD-10-CM

## 2018-12-23 DIAGNOSIS — E781 Pure hyperglyceridemia: Secondary | ICD-10-CM | POA: Diagnosis not present

## 2018-12-23 DIAGNOSIS — D696 Thrombocytopenia, unspecified: Secondary | ICD-10-CM

## 2018-12-23 DIAGNOSIS — I10 Essential (primary) hypertension: Secondary | ICD-10-CM

## 2018-12-23 DIAGNOSIS — F1010 Alcohol abuse, uncomplicated: Secondary | ICD-10-CM

## 2018-12-23 DIAGNOSIS — Z114 Encounter for screening for human immunodeficiency virus [HIV]: Secondary | ICD-10-CM

## 2018-12-23 DIAGNOSIS — E559 Vitamin D deficiency, unspecified: Secondary | ICD-10-CM

## 2018-12-23 MED ORDER — ATENOLOL-CHLORTHALIDONE 50-25 MG PO TABS: 1.0000 | ORAL_TABLET | Freq: Every day | ORAL | 0 refills | Status: DC

## 2018-12-23 MED ORDER — DESVENLAFAXINE SUCCINATE ER 50 MG PO TB24: 50.0000 mg | ORAL_TABLET | Freq: Every day | ORAL | 0 refills | Status: DC

## 2018-12-23 MED ORDER — VITAMIN D (ERGOCALCIFEROL) 1.25 MG (50000 UNIT) PO CAPS: 50000.0000 [IU] | ORAL_CAPSULE | ORAL | 0 refills | Status: DC

## 2018-12-23 NOTE — Progress Notes (Signed)
Name: Danny Chase   MRN: [id number]    DOB: 1975-07-20   Date:12/23/2018       Progress Note  Subjective  Chief Complaint  Chief Complaint  Patient presents with  . Annual Exam    HPI  Patient presents for annual CPE and follow up.  Alcohol use: AUDIT positive, took medication to help him quit in the past but stopped on his own, discussed AA meetings but he said no. Heavy drinker for about 20 years, Today AUDIT was 7 with final score of 13 , discussed again importance of cutting down or quitting.   Mild Major Depression: sees Dr. Toy Care in [place] but not frequently, he missed last appointment and was re-scheduled form June 2020. He is now on 50 mg and states doing well, lack of motivation and has problems sleeping but states doing okay.   HTN: taking Atenolol daily and DBP at home has been elevated, He denies chest pain or palpitation. We will change to Tenoretic and monitor, discussed possible drop of potassium and also to monitor bp at home and let me know if improved with medication  Hypertriglyceridemia: discussed importance of quitting drinking, eating healthy and exercises, last labs showed triglycerides was above 700 and discuss risk of pancreatitis. He has been taking Vascepa occasionally, discussed importance of checking labs and also to take medication daily   USPSTF grade A and B recommendations:  Diet: since COVID-19 eating differently, cooking more at home but not very balanced  Exercise: not lately, discussed importance of being physically active   Depression: phq 9 is negative Depression screen [facility] 2/9 12/23/2018 09/29/2018 06/23/2018 12/13/2017 06/14/2017  Decreased Interest 0 0 0 0 0  Down, Depressed, Hopeless 0 0 0 0 0  PHQ - 2 Score 0 0 0 0 0  Altered sleeping 1 3 0 0 -  Tired, decreased energy 0 0 0 0 -  Change in appetite 0 0 0 0 -  Feeling bad or failure about yourself  0 0 0 0 -  Trouble concentrating 0 0 0 0 -  Moving slowly or fidgety/restless 0 0 0 0 -   Suicidal thoughts 0 0 0 0 -  PHQ-9 Score 1 3 0 0 -  Difficult doing work/chores Not difficult at all Not difficult at all Not difficult at all Not difficult at all -    Hypertension:  BP Readings from Last 3 Encounters:  12/23/18 (!) 140/100  09/29/18 (!) 133/93  06/23/18 126/86    Obesity: Wt Readings from Last 3 Encounters:  12/23/18 202 lb 8 oz (91.9 kg)  09/29/18 195 lb (88.5 kg)  06/23/18 201 lb 3.2 oz (91.3 kg)   BMI Readings from Last 3 Encounters:  12/23/18 28.24 kg/m  09/29/18 27.20 kg/m  06/23/18 28.06 kg/m     Lipids:  Lab Results  Component Value Date   CHOL 245 (H) 06/23/2018   CHOL 226 (A) 11/06/2017   CHOL 192 12/13/2016   Lab Results  Component Value Date   HDL 56 06/23/2018   HDL 54 11/06/2017   HDL 68 12/13/2016   Lab Results  Component Value Date   LDLCALC  06/23/2018     Comment:     . LDL cholesterol not calculated. Triglyceride levels greater than 400 mg/dL invalidate calculated LDL results. . Reference range: <100 . Desirable range <100 mg/dL for primary prevention;   <70 mg/dL for patients with CHD or diabetic patients  with > or = 2 CHD risk factors. Marland Kitchen LDL-C  is now calculated using the Martin-Hopkins  calculation, which is a validated novel method providing  better accuracy than the Friedewald equation in the  estimation of LDL-C.  Horald Pollen et al. Lenox Ahr. 0272;536(64): 2061-2068  (http://education.[facility].com/faq/FAQ164)    LDLCALC 103 11/06/2017   LDLCALC 78 12/13/2016   Lab Results  Component Value Date   TRIG 748 (H) 06/23/2018   TRIG 344 (A) 11/06/2017   TRIG 232 (H) 12/13/2016   Lab Results  Component Value Date   CHOLHDL 4.4 06/23/2018   CHOLHDL 2.8 12/13/2016   No results found for: LDLDIRECT Glucose:  Glucose, Bld  Date Value Ref Range Status  06/23/2018 83 65 - 99 mg/dL Final    Comment:    .            Fasting reference interval .   12/13/2016 82 65 - 99 mg/dL Final      Office Visit  from 12/23/2018 in [facility]  AUDIT-C Score  7       Single STD testing and prevention (HIV/chl/gon/syphilis): refused  Hep C: he will labs at work   Skin cancer: under the care of Dermatologist   Prostate cancer: discussed USPTF  Lab Results  Component Value Date   PSA 0.6 12/13/2016    IPSS Questionnaire (AUA-7): Over the past month.   1)  How often have you had a sensation of not emptying your bladder completely after you finish urinating?  0 - Not at all  2)  How often have you had to urinate again less than two hours after you finished urinating? 0 - Not at all  3)  How often have you found you stopped and started again several times when you urinated?  0 - Not at all  4) How difficult have you found it to postpone urination?  0 - Not at all  5) How often have you had a weak urinary stream?  0 - Not at all  6) How often have you had to push or strain to begin urination?  0 - Not at all  7) How many times did you most typically get up to urinate from the time you went to bed until the time you got up in the morning?  0 - None  Total score:  0-7 mildly symptomatic   8-19 moderately symptomatic   20-35 severely symptomatic    Lung cancer:   Low Dose CT Chest recommended if Age 83-80 years, 30 pack-year currently smoking OR have quit w/in 15years. Patient does not qualify.   ECG:  2019  Advanced Care Planning: A voluntary discussion about advance care planning including the explanation and discussion of advance directives.  Discussed health care proxy and Living will, and the patient was able to identify a health care proxy as mother .  Patient does not have a living will at present time. If patient does have living will, I have requested they bring this to the clinic to be scanned in to their chart.  Patient Active Problem List   Diagnosis Date Noted  . Thrombocytopenia (HCC) 06/23/2018  . Perennial allergic rhinitis with seasonal variation 12/13/2017  .  Vitamin D deficiency 12/13/2017  . Alcohol use disorder, moderate, dependence (HCC) 12/13/2017  . Psoriasis 12/13/2017  . Mild recurrent major depression (HCC) 07/13/2015  . Insomnia 07/13/2015  . HTN (hypertension) 07/13/2015    History reviewed. No pertinent surgical history.  Family History  Problem Relation Age of Onset  . Cancer Mother  Lymphoma  . Stroke Mother   . Cancer Father        Lymphoma, Colon CA  . Hypertension Brother     Social History   Socioeconomic History  . Marital status: Single    Spouse name: Not on file  . Number of children: 1  . Years of education: Not on file  . Highest education level: Bachelor's degree (e.g., BA, AB, BS)  Occupational History  . Occupation: Charity fundraiserweb developer     [place]   Social Needs  . Financial resource strain: Not hard at all  . Food insecurity    Worry: Never true    Inability: Never true  . Transportation needs    Medical: No    Non-medical: No  Tobacco Use  . Smoking status: Never Smoker  . Smokeless tobacco: Never Used  Substance and Sexual Activity  . Alcohol use: Yes    Alcohol/week: 4.0 standard drinks    Types: 4 Glasses of wine per week  . Drug use: No  . Sexual activity: Yes  Lifestyle  . Physical activity    Days per week: 3 days    Minutes per session: 60 min  . Stress: Very much  Relationships  . Social Musicianconnections    Talks on phone: Twice a week    Gets together: Twice a week    Attends religious service: Never    Active member of club or organization: No    Attends meetings of clubs or organizations: Never    Relationship status: Never married  . Intimate partner violence    Fear of current or ex partner: No    Emotionally abused: No    Physically abused: No    Forced sexual activity: No  Other Topics Concern  . Not on file  Social History Narrative   He lives alone, has a son, sees his son every weekend also brother and parents weekly   He plays guitar       Current Outpatient Medications:  .  cetirizine (ZYRTEC) 10 MG tablet, Take 10 mg by mouth at bedtime., Disp: , Rfl:  .  desvenlafaxine (PRISTIQ) 100 MG 24 hr tablet, Take 1 tablet (100 mg total) by mouth daily., Disp: 30 tablet, Rfl: 1 .  fluticasone (FLONASE) 50 MCG/ACT nasal spray, Place 2 sprays into both nostrils daily., Disp: , Rfl: 4 .  HALOBETASOL PROPIONATE 0.05 % FOAM, APPLY TO psoriasis ON ARMS, LEGS, trunk 5 DAYS A WEEK, apply moisturizer AFTER applying foam, Disp: , Rfl: 3 .  Icosapent Ethyl (VASCEPA) 1 g CAPS, Take 2 capsules (2 g total) by mouth 2 (two) times daily., Disp: 360 capsule, Rfl: 1 .  traZODone (DESYREL) 50 MG tablet, Take 1 tablet (50 mg total) by mouth at bedtime as needed for sleep., Disp: 30 tablet, Rfl: 0 .  Vitamin D, Ergocalciferol, (DRISDOL) 1.25 MG (50000 UT) CAPS capsule, Take 1 capsule (50,000 Units total) by mouth once a week. For 12 weeks, Disp: 12 capsule, Rfl: 0 .  atenolol (TENORMIN) 50 MG tablet, Take 0.5-1 tablets (25-50 mg total) by mouth 2 (two) times daily. Half in am and one in pm, Disp: , Rfl:  .  atenolol-chlorthalidone (TENORETIC) 50-25 MG tablet, Take 1 tablet by mouth daily., Disp: 30 tablet, Rfl: 0  No Known Allergies   ROS  Constitutional: Negative for fever or weight change.  Respiratory: Negative for cough and shortness of breath.   Cardiovascular: Negative for chest pain or palpitations.  Gastrointestinal: Negative for  abdominal pain, no bowel changes.  Musculoskeletal: Negative for gait problem or joint swelling.  Skin: Negative for rash.  Neurological: Negative for dizziness or headache.  No other specific complaints in a complete review of systems (except as listed in HPI above).   Objective  Vitals:   12/23/18 0820  BP: (!) 140/100  Pulse: 78  Resp: 16  Temp: (!) 97.1 F (36.2 C)  TempSrc: Oral  SpO2: 96%  Weight: 202 lb 8 oz (91.9 kg)  Height: 5\' 11"  (1.803 m)    Body mass index is 28.24 kg/m.  Physical  Exam  Constitutional: Patient appears well-developed and well-nourished. No distress.  HENT: Head: Normocephalic and atraumatic. Ears: B TMs ok, no erythema or effusion; Nose: Nose normal. Mouth/Throat: Oropharynx is clear and moist. No oropharyngeal exudate.  Eyes: Conjunctivae and EOM are normal. Pupils are equal, round, and reactive to light. No scleral icterus.  Neck: Normal range of motion. Neck supple. No JVD present. No thyromegaly present.  Cardiovascular: Normal rate, regular rhythm and normal heart sounds.  No murmur heard. No BLE edema. Pulmonary/Chest: Effort normal and breath sounds normal. No respiratory distress. Abdominal: Soft. Bowel sounds are normal, no distension. There is no tenderness. no masses MALE GENITALIA: Normal descended testes bilaterally, no masses palpated, no hernias, no lesions, no discharge RECTAL: not done  Musculoskeletal: Normal range of motion, no joint effusions. No gross deformities Neurological: he is alert and oriented to person, place, and time. No cranial nerve deficit. Coordination, balance, strength, speech and gait are normal.  Skin: Skin is warm and dry. No rash noted. No erythema.  Psychiatric: Patient has a normal mood and affect. behavior is normal. Judgment and thought content normal.   PHQ2/9: [facility] 12/23/2018 09/29/2018 06/23/2018 12/13/2017 06/14/2017  Decreased Interest 0 0 0 0 0  Down, Depressed, Hopeless 0 0 0 0 0  PHQ - 2 Score 0 0 0 0 0  Altered sleeping 1 3 0 0 -  Tired, decreased energy 0 0 0 0 -  Change in appetite 0 0 0 0 -  Feeling bad or failure about yourself  0 0 0 0 -  Trouble concentrating 0 0 0 0 -  Moving slowly or fidgety/restless 0 0 0 0 -  Suicidal thoughts 0 0 0 0 -  PHQ-9 Score 1 3 0 0 -  Difficult doing work/chores Not difficult at all Not difficult at all Not difficult at all Not difficult at all -   Doing well on medication   Fall Risk: Fall Risk  12/23/2018 12/23/2018 09/29/2018 06/23/2018  12/13/2017  Falls in the past year? 0 0 0 0 No  Number falls in past yr: 0 0 0 0 -  Injury with Fall? 0 0 0 0 -     Functional Status Survey: Is the patient deaf or have difficulty hearing?: No Does the patient have difficulty seeing, even when wearing glasses/contacts?: No Does the patient have difficulty concentrating, remembering, or making decisions?: No Does the patient have difficulty walking or climbing stairs?: No Does the patient have difficulty dressing or bathing?: No Does the patient have difficulty doing errands alone such as visiting a doctor's office or shopping?: No   Assessment & Plan  1. Essential hypertension  - atenolol-chlorthalidone (TENORETIC) 50-25 MG tablet; Take 1 tablet by mouth daily.  Dispense: 30 tablet; Refill: 0  2. Encounter for screening for HIV  He refused  3. Hypertriglyceridemia  He needs to resume medication daily, he states he  will have labs done at work soon   4. Mild recurrent major depression (HCC)  Under the care of psychiatrist   5. Alcohol use disorder, mild, abuse  Discussed the importance of quitting   6. Thrombocytopenia (HCC)  Resolved, but he will have it rechecked at work soon   7. Vitamin D insufficiency  - Vitamin D, Ergocalciferol, (DRISDOL) 1.25 MG (50000 UT) CAPS capsule; Take 1 capsule (50,000 Units total) by mouth once a week. For 12 weeks  Dispense: 12 capsule; Refill: 0   -Prostate cancer screening and PSA options (with potential risks and benefits of testing vs not testing) were discussed along with recent recs/guidelines. -USPSTF grade A and B recommendations reviewed with patient; age-appropriate recommendations, preventive care, screening tests, etc discussed and encouraged; healthy living encouraged; see AVS for patient education given to patient -Discussed importance of 150 minutes of physical activity weekly, eat two servings of fish weekly, eat one serving of tree nuts ( cashews, pistachios, pecans,  almonds.Marland Kitchen.) every other day, eat 6 servings of fruit/vegetables daily and drink plenty of water and avoid sweet beverages.

## 2019-01-16 ENCOUNTER — Other Ambulatory Visit: Payer: Self-pay | Admitting: Family Medicine

## 2019-01-16 DIAGNOSIS — I10 Essential (primary) hypertension: Secondary | ICD-10-CM

## 2019-01-16 NOTE — Telephone Encounter (Signed)
Hypertension medication request:  Atenolol-chlorthalidone to [facility]   Last office visit pertaining to hypertension: 12/23/2018   BP Readings from Last 3 Encounters:  12/23/18 (!) 140/100  09/29/18 (!) 133/93  06/23/18 126/86    Lab Results  Component Value Date   CREATININE 0.73 06/23/2018   BUN 9 06/23/2018   NA 140 06/23/2018   K 4.1 06/23/2018   CL 103 06/23/2018   CO2 25 06/23/2018     Follow up on 06/25/2019

## 2019-01-20 NOTE — Telephone Encounter (Signed)
Left voice message informing pt that prescription has been sent to pharmacy. Also asked him to schedule appt sooner than January per Dr Ancil Boozer request

## 2019-02-26 ENCOUNTER — Other Ambulatory Visit: Payer: Self-pay | Admitting: Family Medicine

## 2019-02-26 DIAGNOSIS — I10 Essential (primary) hypertension: Secondary | ICD-10-CM

## 2019-03-06 ENCOUNTER — Other Ambulatory Visit: Payer: Self-pay | Admitting: Family Medicine

## 2019-03-12 ENCOUNTER — Other Ambulatory Visit: Payer: Self-pay | Admitting: Family Medicine

## 2019-03-12 DIAGNOSIS — E559 Vitamin D deficiency, unspecified: Secondary | ICD-10-CM

## 2019-03-12 NOTE — Telephone Encounter (Signed)
Requested medication (s) are due for refill today: yes  Requested medication (s) are on the active medication list: yes  Last refill:  12/23/2018  Future visit scheduled: yes  Notes to clinic:  Refill cannot be delegated    Requested Prescriptions  Pending Prescriptions Disp Refills   Vitamin D, Ergocalciferol, (DRISDOL) 1.25 MG (50000 UT) CAPS capsule [Pharmacy Med Name: VITAMIN D2 1.25MG (50,000 UNIT)] 12 capsule 0    Sig: Take 1 capsule (50,000 Units total) by mouth once a week. For 12 weeks     Endocrinology:  Vitamins - Vitamin D Supplementation Failed - 03/12/2019  1:36 AM      Failed - 50,000 IU strengths are not delegated      Failed - Phosphate in normal range and within 360 days    No results found for: PHOS       Failed - Vitamin D in normal range and within 360 days    Vit D, 25-Hydroxy  Date Value Ref Range Status  11/06/2017 24.8  Final         Passed - Ca in normal range and within 360 days    Calcium  Date Value Ref Range Status  06/23/2018 9.2 8.6 - 10.3 mg/dL Final         Passed - Valid encounter within last 12 months    Recent Outpatient Visits          2 months ago Essential hypertension   [facility], Drue Stager, MD   5 months ago Mild recurrent major depression [facility])   [facility] Steele Sizer, MD   8 months ago Mild recurrent major depression [facility])   [facility] Steele Sizer, MD   1 year ago Mild recurrent major depression [facility])   [facility] Steele Sizer, MD   1 year ago Vitamin D insufficiency   [place], [place]      Future Appointments            In 3 months Steele Sizer, MD [facility], [facility]

## 2019-03-14 LAB — HEPATIC FUNCTION PANEL
ALT: 56 — AB (ref 10–40)
AST: 38 (ref 14–40)
Alkaline Phosphatase: 100 (ref 25–125)

## 2019-03-14 LAB — CBC AND DIFFERENTIAL
HCT: 49 (ref 41–53)
Hemoglobin: 16.8 (ref 13.5–17.5)
Platelets: 183 (ref 150–399)
WBC: 5.1

## 2019-03-14 LAB — TSH: TSH: 4.85 (ref 0.41–5.90)

## 2019-03-14 LAB — LIPID PANEL
Cholesterol: 250 — AB (ref 0–200)
HDL: 54 (ref 35–70)
LDL Cholesterol: 107
Triglycerides: 518 — AB (ref 40–160)

## 2019-03-14 LAB — VITAMIN D 25 HYDROXY (VIT D DEFICIENCY, FRACTURES): Vit D, 25-Hydroxy: 42.3

## 2019-03-14 LAB — BASIC METABOLIC PANEL
BUN: 11 (ref 4–21)
Creatinine: 0.9 (ref 0.6–1.3)
Glucose: 77
Potassium: 4.3 (ref 3.4–5.3)

## 2019-03-14 LAB — IRON,TIBC AND FERRITIN PANEL: Iron: 123

## 2019-03-14 LAB — HEMOGLOBIN A1C: Hemoglobin A1C: 5.1

## 2019-03-19 ENCOUNTER — Other Ambulatory Visit: Payer: Self-pay

## 2019-06-02 ENCOUNTER — Other Ambulatory Visit: Payer: Self-pay | Admitting: Family Medicine

## 2019-06-02 DIAGNOSIS — E559 Vitamin D deficiency, unspecified: Secondary | ICD-10-CM

## 2019-06-25 ENCOUNTER — Encounter: Payer: Self-pay | Admitting: Family Medicine

## 2019-06-25 ENCOUNTER — Ambulatory Visit (INDEPENDENT_AMBULATORY_CARE_PROVIDER_SITE_OTHER): Payer: BC Managed Care – PPO | Admitting: Family Medicine

## 2019-06-25 VITALS — BP 141/99 | Temp 98.5°F

## 2019-06-25 DIAGNOSIS — E559 Vitamin D deficiency, unspecified: Secondary | ICD-10-CM

## 2019-06-25 DIAGNOSIS — I10 Essential (primary) hypertension: Secondary | ICD-10-CM

## 2019-06-25 DIAGNOSIS — E781 Pure hyperglyceridemia: Secondary | ICD-10-CM

## 2019-06-25 DIAGNOSIS — F330 Major depressive disorder, recurrent, mild: Secondary | ICD-10-CM | POA: Diagnosis not present

## 2019-06-25 MED ORDER — ICOSAPENT ETHYL 1 G PO CAPS: 2.0000 g | ORAL_CAPSULE | Freq: Two times a day (BID) | ORAL | 1 refills | Status: DC

## 2019-06-25 MED ORDER — ATENOLOL-CHLORTHALIDONE 50-25 MG PO TABS: 1.0000 | ORAL_TABLET | Freq: Every day | ORAL | 0 refills | Status: DC

## 2019-06-25 MED ORDER — VITAMIN D (ERGOCALCIFEROL) 1.25 MG (50000 UNIT) PO CAPS: 50000.0000 [IU] | ORAL_CAPSULE | ORAL | 0 refills | Status: DC

## 2019-06-25 NOTE — Progress Notes (Signed)
Name: Danny Chase   MRN: [id number]    DOB: Aug 13, 1975   Date:06/25/2019       Progress Note  Subjective  Chief Complaint  Chief Complaint  Patient presents with  . Depression  . Hypertension    I connected with  Ellis Parents on 06/25/19 at  8:20 AM EST by telephone and verified that I am speaking with the correct person using two identifiers.  I discussed the limitations, risks, security and privacy concerns of performing an evaluation and management service by telephone and the availability of in person appointments. Staff also discussed with the patient that there may be a patient responsible charge related to this service. Patient Location: home  Provider Location: [facility]   HPI  Alcohol use: AUDIT positive, took medication to help him quit in the past but stopped on his own, discussed AA meetings but he said no. Heavy drinker for about 21 years. Today he told me he is drinking about the same  Mild Major Depression: sees Dr. Evelene Croon in [place] about once a year . He is now on Pristiq  50 mg and states doing well. He states lack of routine is not good for him   HTN: taking Atenolol daily andDBP at home has been elevated, He denies chest pain or palpitation.We changed to Tenoretic but he did not monitor his bp and states he is back on Atenolol , bp today was 141/99   Hypertriglyceridemia: discussed importance of quitting drinking, eating healthy and exercises,last labs showed triglycerides dropped from  700 to mid 500's . He states that because of COVID-19 and holidays his routine has not been very good and forgets to take medication  Patient Active Problem List   Diagnosis Date Noted  . Thrombocytopenia (HCC) 06/23/2018  . Perennial allergic rhinitis with seasonal variation 12/13/2017  . Vitamin D deficiency 12/13/2017  . Alcohol use disorder, moderate, dependence (HCC) 12/13/2017  . Psoriasis 12/13/2017  . Mild recurrent major depression  (HCC) 07/13/2015  . Insomnia 07/13/2015  . HTN (hypertension) 07/13/2015    History reviewed. No pertinent surgical history.  Family History  Problem Relation Age of Onset  . Cancer Mother        Lymphoma  . Stroke Mother   . Cancer Father        Lymphoma, Colon CA  . Hypertension Brother       Current Outpatient Medications:  .  atenolol-chlorthalidone (TENORETIC) 50-25 MG tablet, Take 1 tablet by mouth daily., Disp: 90 tablet, Rfl: 0 .  cetirizine (ZYRTEC) 10 MG tablet, Take 10 mg by mouth at bedtime., Disp: , Rfl:  .  desvenlafaxine (PRISTIQ) 50 MG 24 hr tablet, Take 1 tablet (50 mg total) by mouth daily., Disp: 30 tablet, Rfl: 0 .  fluticasone (FLONASE) 50 MCG/ACT nasal spray, Place 2 sprays into both nostrils daily., Disp: , Rfl: 4 .  icosapent Ethyl (VASCEPA) 1 g capsule, Take 2 capsules (2 g total) by mouth 2 (two) times daily., Disp: 360 capsule, Rfl: 1 .  traZODone (DESYREL) 50 MG tablet, Take 1 tablet (50 mg total) by mouth at bedtime as needed for sleep., Disp: 30 tablet, Rfl: 0 .  Vitamin D, Ergocalciferol, (DRISDOL) 1.25 MG (50000 UT) CAPS capsule, Take 1 capsule (50,000 Units total) by mouth once a week. For 12 weeks, Disp: 12 capsule, Rfl: 0 .  HALOBETASOL PROPIONATE 0.05 % FOAM, APPLY TO psoriasis ON ARMS, LEGS, trunk 5 DAYS A WEEK, apply moisturizer AFTER applying foam, Disp: ,  Rfl: 3  No Known Allergies  I personally reviewed active problem list, medication list, allergies, family history, social history, health maintenance with the patient/caregiver today.   ROS  Ten systems reviewed and is negative except as mentioned in HPI   Objective  Virtual encounter, vitals  Obtained at home  Vitals:   06/25/19 0850  BP: (!) 141/99  Temp: 98.5 F (36.9 C)  .  There is no height or weight on file to calculate BMI.  Physical Exam  Awake, alert and oriented   PHQ2/9: Depression screen [facility] 2/9 06/25/2019 12/23/2018 09/29/2018 06/23/2018 12/13/2017  Decreased  Interest 0 0 0 0 0  Down, Depressed, Hopeless 0 0 0 0 0  PHQ - 2 Score 0 0 0 0 0  Altered sleeping 0 1 3 0 0  Tired, decreased energy 0 0 0 0 0  Change in appetite 0 0 0 0 0  Feeling bad or failure about yourself  0 0 0 0 0  Trouble concentrating 0 0 0 0 0  Moving slowly or fidgety/restless 0 0 0 0 0  Suicidal thoughts 0 0 0 0 0  PHQ-9 Score 0 1 3 0 0  Difficult doing work/chores - Not difficult at all Not difficult at all Not difficult at all Not difficult at all   PHQ-2/9 Result is negative.    Fall Risk: Fall Risk  06/25/2019 12/23/2018 12/23/2018 09/29/2018 06/23/2018  Falls in the past year? 0 0 0 0 0  Number falls in past yr: 0 0 0 0 0  Injury with Fall? 0 0 0 0 0     Assessment & Plan  1. Essential hypertension  - atenolol-chlorthalidone (TENORETIC) 50-25 MG tablet; Take 1 tablet by mouth daily.  Dispense: 90 tablet; Refill: 0  2. Vitamin D insufficiency  - Vitamin D, Ergocalciferol, (DRISDOL) 1.25 MG (50000 UT) CAPS capsule; Take 1 capsule (50,000 Units total) by mouth once a week. For 12 weeks  Dispense: 12 capsule; Refill: 0  3. Hypertriglyceridemia  Needs to take medication daily   4. Mild recurrent major depression ([place])  Continue follow up with psychiatrist and take medication   I discussed the assessment and treatment plan with the patient. The patient was provided an opportunity to ask questions and all were answered. The patient agreed with the plan and demonstrated an understanding of the instructions.   The patient was advised to call back or seek an in-person evaluation if the symptoms worsen or if the condition fails to improve as anticipated.  I provided 25 minutes of non-face-to-face time during this encounter.  Loistine Chance, MD

## 2019-07-14 ENCOUNTER — Other Ambulatory Visit: Payer: Self-pay

## 2019-07-14 ENCOUNTER — Encounter: Payer: Self-pay | Admitting: Family Medicine

## 2019-07-14 ENCOUNTER — Ambulatory Visit (INDEPENDENT_AMBULATORY_CARE_PROVIDER_SITE_OTHER): Payer: BC Managed Care – PPO | Admitting: Family Medicine

## 2019-07-14 VITALS — BP 136/90 | HR 115 | Temp 97.5°F | Resp 16 | Ht 71.0 in | Wt 200.1 lb

## 2019-07-14 DIAGNOSIS — R7989 Other specified abnormal findings of blood chemistry: Secondary | ICD-10-CM

## 2019-07-14 DIAGNOSIS — E781 Pure hyperglyceridemia: Secondary | ICD-10-CM | POA: Diagnosis not present

## 2019-07-14 DIAGNOSIS — R1011 Right upper quadrant pain: Secondary | ICD-10-CM | POA: Diagnosis not present

## 2019-07-14 DIAGNOSIS — R0789 Other chest pain: Secondary | ICD-10-CM

## 2019-07-14 DIAGNOSIS — R000 Tachycardia, unspecified: Secondary | ICD-10-CM

## 2019-07-14 NOTE — Progress Notes (Signed)
Name: Danny Chase   MRN: [id number]    DOB: May 11, 1976   Date:07/14/2019       Progress Note  Subjective  Chief Complaint  Chief Complaint  Patient presents with  . Pulled Muscle    trouble with breathing in certain positions- wanted check pain has gotten better. But has to bend weird ways and feels like something is pressuring against something.  . Rib Pain    Onset- 2 Saturdays ago, the next day after playing disc golf. Sharp and dull pain worst throughout the week. Feels it after moving certain ways.     HPI  Right lower rib cage pain : he states he plays disc golf, he states he resumed playing disc golf a couple of months ago. He states that pain seems deeper than when he pulls a muscle. He states certain movements bothers him and sometimes when he lays down it also brothers him. Denies bruises or rashes. Pain started 2 weeks ago , slightly better over the past couple of days. No nausea, vomiting, but he has a history of elevated liver enzymes and high triglycerides , we will check labs and rule out gallstones and liver disease also.   HTN: he did not take bp medication this am, states at home bp has been 130's/90's also   Patient Active Problem List   Diagnosis Date Noted  . Thrombocytopenia ([place]) 06/23/2018  . Perennial allergic rhinitis with seasonal variation 12/13/2017  . Vitamin D deficiency 12/13/2017  . Alcohol use disorder, moderate, dependence ([place]) 12/13/2017  . Psoriasis 12/13/2017  . Mild recurrent major depression ([place]) 07/13/2015  . Insomnia 07/13/2015  . HTN (hypertension) 07/13/2015    History reviewed. No pertinent surgical history.  Family History  Problem Relation Age of Onset  . Cancer Mother        Lymphoma  . Stroke Mother   . Cancer Father        Lymphoma, Colon CA  . Hypertension Brother     Current Outpatient Medications:  .  atenolol-chlorthalidone (TENORETIC) 50-25 MG tablet, Take 1 tablet by mouth daily., Disp: 90 tablet, Rfl: 0 .   cetirizine (ZYRTEC) 10 MG tablet, Take 10 mg by mouth at bedtime., Disp: , Rfl:  .  desvenlafaxine (PRISTIQ) 50 MG 24 hr tablet, Take 1 tablet (50 mg total) by mouth daily., Disp: 30 tablet, Rfl: 0 .  fluticasone (FLONASE) 50 MCG/ACT nasal spray, Place 2 sprays into both nostrils daily., Disp: , Rfl: 4 .  icosapent Ethyl (VASCEPA) 1 g capsule, Take 2 capsules (2 g total) by mouth 2 (two) times daily., Disp: 360 capsule, Rfl: 1 .  traZODone (DESYREL) 50 MG tablet, Take 1 tablet (50 mg total) by mouth at bedtime as needed for sleep., Disp: 30 tablet, Rfl: 0 .  Vitamin D, Ergocalciferol, (DRISDOL) 1.25 MG (50000 UT) CAPS capsule, Take 1 capsule (50,000 Units total) by mouth once a week. For 12 weeks, Disp: 12 capsule, Rfl: 0 .  HALOBETASOL PROPIONATE 0.05 % FOAM, APPLY TO psoriasis ON ARMS, LEGS, trunk 5 DAYS A WEEK, apply moisturizer AFTER applying foam, Disp: , Rfl: 3  No Known Allergies  I personally reviewed active problem list, medication list, allergies, family history, social history with the patient/caregiver today.   ROS  Constitutional: Negative for fever or weight change.  Respiratory: Negative for cough and shortness of breath.   Cardiovascular: Negative for chest pain or palpitations.  Gastrointestinal: Positive for right upper quadrant abdominal pain, no bowel changes.  Musculoskeletal: Negative  for gait problem or joint swelling.  Skin: Negative for rash.  Neurological: Negative for dizziness or headache.  No other specific complaints in a complete review of systems (except as listed in HPI above).  Objective  Vitals:   07/14/19 1138  BP: 136/90  Pulse: (!) 115  Resp: 16  Temp: (!) 97.5 F (36.4 C)  TempSrc: Temporal  SpO2: 96%  Weight: 200 lb 1.6 oz (90.8 kg)  Height: 5\' 11"  (1.803 m)    Body mass index is 27.91 kg/m.  Physical Exam  Constitutional: Patient appears well-developed and well-nourished. Overweight.  No distress.  HEENT: head atraumatic,  normocephalic, pupils equal and reactive to light Cardiovascular: Normal rate, regular rhythm and normal heart sounds.  No murmur heard. No BLE edema. Tender during palpation of right lower rib cage  Pulmonary/Chest: Effort normal and breath sounds normal. No respiratory distress. Abdominal: Soft.  There is tenderness of RUQ , no rashes , no bruises  Psychiatric: Patient has a normal mood and affect. behavior is normal. Judgment and thought content normal.   PHQ2/9: Depression screen [facility] 2/9 07/14/2019 06/25/2019 12/23/2018 09/29/2018 06/23/2018  Decreased Interest 0 0 0 0 0  Down, Depressed, Hopeless 0 0 0 0 0  PHQ - 2 Score 0 0 0 0 0  Altered sleeping 1 0 1 3 0  Tired, decreased energy 0 0 0 0 0  Change in appetite 0 0 0 0 0  Feeling bad or failure about yourself  0 0 0 0 0  Trouble concentrating 0 0 0 0 0  Moving slowly or fidgety/restless 0 0 0 0 0  Suicidal thoughts 0 0 0 0 0  PHQ-9 Score 1 0 1 3 0  Difficult doing work/chores - - Not difficult at all Not difficult at all Not difficult at all    phq 9 is negative   Fall Risk: Fall Risk  07/14/2019 06/25/2019 12/23/2018 12/23/2018 09/29/2018  Falls in the past year? 0 0 0 0 0  Number falls in past yr: 0 0 0 0 0  Injury with Fall? 0 0 0 0 0     Functional Status Survey: Is the patient deaf or have difficulty hearing?: No Does the patient have difficulty seeing, even when wearing glasses/contacts?: Yes Does the patient have difficulty concentrating, remembering, or making decisions?: No Does the patient have difficulty walking or climbing stairs?: No Does the patient have difficulty dressing or bathing?: No Does the patient have difficulty doing errands alone such as visiting a doctor's office or shopping?: No    Assessment & Plan  1. Right-sided chest wall pain  - COMPLETE METABOLIC PANEL WITH GFR - CBC with Differential/Platelet  2. Hypertriglyceridemia  - Lipid panel  3. Elevated LFTs  - COMPLETE METABOLIC PANEL WITH  GFR - CBC with Differential/Platelet  4. RUQ pain  - COMPLETE METABOLIC PANEL WITH GFR - CBC with Differential/Platelet - Lipid panel - 10/01/2018 Abdomen Limited RUQ; Future  5. Tachycardia  - TSH

## 2019-07-28 ENCOUNTER — Ambulatory Visit
Admission: RE | Admit: 2019-07-28 | Discharge: 2019-07-28 | Disposition: A | Discharge status: Home / Self Care | Payer: BC Managed Care – PPO | Source: Ambulatory Visit | Attending: Family Medicine | Admitting: Family Medicine

## 2019-07-28 ENCOUNTER — Other Ambulatory Visit: Payer: Self-pay

## 2019-07-28 DIAGNOSIS — R1011 Right upper quadrant pain: Secondary | ICD-10-CM | POA: Insufficient documentation

## 2019-07-28 DIAGNOSIS — K7689 Other specified diseases of liver: Secondary | ICD-10-CM | POA: Diagnosis not present

## 2019-07-29 DIAGNOSIS — Z0189 Encounter for other specified special examinations: Secondary | ICD-10-CM | POA: Diagnosis not present

## 2019-07-29 LAB — LIPID PANEL
Cholesterol: 207 — AB (ref 0–200)
HDL: 48 (ref 35–70)
LDL Cholesterol: 80
Triglycerides: 498 — AB (ref 40–160)

## 2019-07-29 LAB — BASIC METABOLIC PANEL
BUN: 7 (ref 4–21)
Chloride: 98 — AB (ref 99–108)
Creatinine: 0.9 (ref 0.6–1.3)
Glucose: 94
Potassium: 4.2 (ref 3.4–5.3)
Sodium: 138 (ref 137–147)

## 2019-07-29 LAB — COMPREHENSIVE METABOLIC PANEL
Albumin: 4.5 (ref 3.5–5.0)
Calcium: 9.4 (ref 8.7–10.7)
GFR calc Af Amer: 119
GFR calc non Af Amer: 103
Globulin: 3.3

## 2019-07-29 LAB — CBC AND DIFFERENTIAL
HCT: 48 (ref 41–53)
Hemoglobin: 17.1 (ref 13.5–17.5)
Platelets: 201 (ref 150–399)
WBC: 5.6

## 2019-07-29 LAB — HEMOGLOBIN A1C: Hemoglobin A1C: 5

## 2019-07-29 LAB — CBC: RBC: 5.33 — AB (ref 3.87–5.11)

## 2019-07-29 LAB — PSA: PSA: 0.6

## 2019-07-29 LAB — VITAMIN D 25 HYDROXY (VIT D DEFICIENCY, FRACTURES): Vit D, 25-Hydroxy: 47.6

## 2019-07-29 LAB — TSH: TSH: 2.31 (ref 0.41–5.90)

## 2019-07-31 ENCOUNTER — Other Ambulatory Visit: Payer: Self-pay | Admitting: Family Medicine

## 2019-07-31 ENCOUNTER — Encounter: Payer: Self-pay | Admitting: Family Medicine

## 2019-07-31 DIAGNOSIS — R7989 Other specified abnormal findings of blood chemistry: Secondary | ICD-10-CM

## 2019-07-31 DIAGNOSIS — F1020 Alcohol dependence, uncomplicated: Secondary | ICD-10-CM

## 2019-08-03 ENCOUNTER — Encounter: Payer: Self-pay | Admitting: Family Medicine

## 2019-08-03 NOTE — Progress Notes (Signed)
Attempted to reach pt, left VM to CB. 

## 2019-08-03 NOTE — Progress Notes (Unsigned)
Patient unavailable left vm for him to return our call.

## 2019-08-03 NOTE — Progress Notes (Unsigned)
Attempted to contact patient left message to call back

## 2019-08-04 ENCOUNTER — Other Ambulatory Visit: Payer: Self-pay

## 2019-08-04 ENCOUNTER — Ambulatory Visit: Payer: Self-pay

## 2019-08-04 NOTE — Telephone Encounter (Signed)
Patient returned call , advised of recommendation per CRM 08/03/19:  "Please call patient, explained labs showed high triglycerides and liver enzymes and he needs to see gastroenterologist to rule out damage to the liver, he also needs to stop drinking alcohol to slow down the damage ". Voiced understanding. awaiting to hear from GI

## 2019-08-05 ENCOUNTER — Other Ambulatory Visit: Payer: Self-pay | Admitting: Family Medicine

## 2019-08-17 ENCOUNTER — Other Ambulatory Visit: Payer: Self-pay

## 2019-08-19 ENCOUNTER — Telehealth: Payer: Self-pay | Admitting: Gastroenterology

## 2019-08-19 ENCOUNTER — Encounter: Payer: Self-pay | Admitting: Gastroenterology

## 2019-08-19 NOTE — Telephone Encounter (Signed)
Pt is  Calling for Danny Chase to go over his apt for tomorrow

## 2019-08-19 NOTE — Telephone Encounter (Signed)
Called patient back.

## 2019-08-20 ENCOUNTER — Ambulatory Visit (INDEPENDENT_AMBULATORY_CARE_PROVIDER_SITE_OTHER): Payer: BC Managed Care – PPO | Admitting: Gastroenterology

## 2019-08-20 ENCOUNTER — Encounter: Payer: Self-pay | Admitting: Family Medicine

## 2019-08-20 ENCOUNTER — Encounter: Payer: Self-pay | Admitting: Gastroenterology

## 2019-08-20 DIAGNOSIS — K760 Fatty (change of) liver, not elsewhere classified: Secondary | ICD-10-CM | POA: Diagnosis not present

## 2019-08-20 DIAGNOSIS — R748 Abnormal levels of other serum enzymes: Secondary | ICD-10-CM | POA: Diagnosis not present

## 2019-08-20 NOTE — Progress Notes (Signed)
Danny Chase [address]  [address]  [place], [place] [address]  Main: [phone]  Fax: [phone]   Gastroenterology Consultation  Referring Provider:     Alba Cory, MD Primary Care Physician:  Alba Cory, MD Reason for Consultation:    Elevated liver enzymes        HPI:   Virtual Visit via Video Note  I connected with patient on 08/20/19 at 11:30 AM EST by video ([email or web address]) and verified that I am speaking with the correct person using two identifiers.   I discussed the limitations, risks, security and privacy concerns of performing an evaluation and management service by video and the availability of in person appointments. I also discussed with the patient that there may be a patient responsible charge related to this service. The patient expressed understanding and agreed to proceed.  Location of the patient: Home Location of provider: Home Participating persons: Patient and provider only (Nursing staff checked in patient via phone but were not physically involved in the video interaction - see their notes)   History of Present Illness: Chief Complaint  Patient presents with  . New Patient (Initial Visit)  . Elevated Hepatic Enzymes    no symptoms     Danny Chase is a 44 y.o. y/o male referred for consultation & management  by Dr. Carlynn Purl, Danna Hefty, MD.  Patient was seen by his primary care provider for right upper quadrant abdominal pain.  Lab work was done and showed elevated liver enzymes.  In the labs scanned and sent to [place], and available on epic, there is a CMP, but it is missing his liver enzymes.  We will try to contact the lab or his PCP to obtain these results.  However, looking back at his chart he has had mildly elevated transaminases since 2018.  He reports daily alcohol intake with wine.  States he can finish a bottle in 1 evening.  Has been doing this for years.  No episodes of bleeding or confusion.  Denies any over-the-counter  products or herbal supplements.  No recent medication changes.  Past Medical History:  Diagnosis Date  . Allergy   . Anxiety   . Depression   . Insomnia     History reviewed. No pertinent surgical history.  Prior to Admission medications   Medication Sig Start Date End Date Taking? Authorizing Provider  atenolol-chlorthalidone (TENORETIC) 50-25 MG tablet Take 1 tablet by mouth daily. 06/25/19  Yes Sowles, Danna Hefty, MD  cetirizine (ZYRTEC) 10 MG tablet Take 10 mg by mouth at bedtime.   Yes [provider]  desvenlafaxine (PRISTIQ) 50 MG 24 hr tablet Take 1 tablet (50 mg total) by mouth daily. 12/23/18  Yes Sowles, Danna Hefty, MD  fluticasone (FLONASE) 50 MCG/ACT nasal spray Place 2 sprays into both nostrils daily. 04/29/15  Yes [provider]  icosapent Ethyl (VASCEPA) 1 g capsule Take 2 capsules (2 g total) by mouth 2 (two) times daily. 06/25/19  Yes Sowles, Danna Hefty, MD  traZODone (DESYREL) 50 MG tablet SMARTSIG:1 Tablet(s) By Mouth Every Night 03/11/19  Yes [provider]  Vitamin D, Ergocalciferol, (DRISDOL) 1.25 MG (50000 UT) CAPS capsule Take 1 capsule (50,000 Units total) by mouth once a week. For 12 weeks 06/25/19  Yes Alba Cory, MD    Family History  Problem Relation Age of Onset  . Cancer Mother        Lymphoma  . Stroke Mother   . Cancer Father  Lymphoma, Colon CA  . Hypertension Brother      Social History   Tobacco Use  . Smoking status: Never Smoker  . Smokeless tobacco: Never Used  Substance Use Topics  . Alcohol use: Yes    Alcohol/week: 7.0 standard drinks    Types: 7 Glasses of wine per week  . Drug use: No    Allergies as of 08/20/2019  . (No Known Allergies)    Review of Systems:    All systems reviewed and negative except where noted in HPI.   Observations/Objective:  Labs: CBC    Component Value Date/Time   WBC 5.6 07/29/2019 0000   WBC 4.1 06/23/2018 0958   RBC 5.33 (A) 07/29/2019 0000   HGB 17.1 07/29/2019  0000   HCT 48 07/29/2019 0000   PLT 201 07/29/2019 0000   MCV 92.9 06/23/2018 0958   MCH 32.4 06/23/2018 0958   MCHC 34.9 06/23/2018 0958   RDW 13.2 06/23/2018 0958   LYMPHSABS 1,710 06/23/2018 0958   MONOABS 240 12/13/2016 1003   EOSABS 131 06/23/2018 0958   BASOSABS 29 06/23/2018 0958   CMP     Component Value Date/Time   NA 138 07/29/2019 0000   K 4.2 07/29/2019 0000   CL 98 (A) 07/29/2019 0000   CO2 25 06/23/2018 0958   GLUCOSE 83 06/23/2018 0958   BUN 7 07/29/2019 0000   CREATININE 0.9 07/29/2019 0000   CREATININE 0.73 06/23/2018 0958   CALCIUM 9.4 07/29/2019 0000   PROT 7.4 06/23/2018 0958   ALBUMIN 4.5 07/29/2019 0000   AST 38 03/14/2019 0000   ALT 56 (A) 03/14/2019 0000   ALKPHOS 100 03/14/2019 0000   BILITOT 0.2 06/23/2018 0958   GFRNONAA 103 07/29/2019 0000   GFRNONAA 113 06/23/2018 0958   GFRAA 119 07/29/2019 0000   GFRAA 131 06/23/2018 0958    Imaging Studies: US Abdomen Limited RUQ  Result Date: 07/28/2019 CLINICAL DATA:  Right upper quadrant pain EXAM: ULTRASOUND ABDOMEN LIMITED RIGHT UPPER QUADRANT COMPARISON:  None. FINDINGS: Gallbladder: No gallstones or wall thickening visualized. There is no pericholecystic fluid. No sonographic Murphy sign noted by sonographer. Common bile duct: Diameter: 3 mm. No intrahepatic or extrahepatic biliary duct dilatation. Liver: No focal lesion identified. Liver echogenicity overall is increased. There are areas suggesting relative fatty sparing toward the dome of the liver. Portal vein is patent on color Doppler imaging with normal direction of blood flow towards the liver. Other: None. IMPRESSION: Increased liver echogenicity, a finding indicative of hepatic steatosis. Suspect a degree of patchy fatty sparing peripherally. No focal liver lesions evident. Study otherwise unremarkable. Electronically Signed   By: Lowella Grip III M.D.   On: 07/28/2019 11:18    Assessment and Plan:   Danny Chase is a 44 y.o. y/o male  has been referred for elevated liver enzymes  Assessment and Plan: We will try to obtain missing lab work from his recent blood draw at his PCP office  We will complete fatty liver work-up with autoimmune and viral hepatitis work-up  If no immunity to hepatitis A and B, would recommend vaccination.  Patient thinks he might have had the hepatitis A vaccine 10 years ago before visiting [place]  Finding of fatty liver on imaging discussed with patient Diet, weight loss, and exercise encouraged along with avoiding hepatotoxic drugs including alcohol Risk of progression to cirrhosis if above measures are not instituted were discussed as well, and patient verbalized understanding  Patient is due for screening colonoscopy and  this was discussed.  Patient does not want to schedule at this time and would like to complete his liver enzymes work-up at this time.  Follow Up Instructions:   I discussed the assessment and treatment plan with the patient. The patient was provided an opportunity to ask questions and all were answered. The patient agreed with the plan and demonstrated an understanding of the instructions.   The patient was advised to call back or seek an in-person evaluation if the symptoms worsen or if the condition fails to improve as anticipated.  I provided 18 minutes of face-to-face time via video software during this encounter.  Additional time was spent in reviewing patient's chart, placing orders etc.   Pasty Spillers, MD  Speech recognition software was used to dictate the above note.

## 2019-08-20 NOTE — Patient Instructions (Signed)
Fatty Liver Disease  Fatty liver disease occurs when too much fat has built up in your liver cells. Fatty liver disease is also called hepatic steatosis or steatohepatitis. The liver removes harmful substances from your bloodstream and produces fluids that your body needs. It also helps your body use and store energy from the food you eat. In many cases, fatty liver disease does not cause symptoms or problems. It is often diagnosed when tests are being done for other reasons. However, over time, fatty liver can cause inflammation that may lead to more serious liver problems, such as scarring of the liver (cirrhosis) and liver failure. Fatty liver is associated with insulin resistance, increased body fat, high blood pressure (hypertension), and high cholesterol. These are features of metabolic syndrome and increase your risk for stroke, diabetes, and heart disease. What are the causes? This condition may be caused by:  Drinking too much alcohol.  Poor nutrition.  Obesity.  Cushing's syndrome.  Diabetes.  High cholesterol.  Certain drugs.  Poisons.  Some viral infections.  Pregnancy. What increases the risk? You are more likely to develop this condition if you:  Abuse alcohol.  Are overweight.  Have diabetes.  Have hepatitis.  Have a high triglyceride level.  Are pregnant. What are the signs or symptoms? Fatty liver disease often does not cause symptoms. If symptoms do develop, they can include:  Fatigue.  Weakness.  Weight loss.  Confusion.  Abdominal pain.  Nausea and vomiting.  Yellowing of your skin and the white parts of your eyes (jaundice).  Itchy skin. How is this diagnosed? This condition may be diagnosed by:  A physical exam and medical history.  Blood tests.  Imaging tests, such as an ultrasound, CT scan, or MRI.  A liver biopsy. A small sample of liver tissue is removed using a needle. The sample is then looked at under a microscope. How  is this treated? Fatty liver disease is often caused by other health conditions. Treatment for fatty liver may involve medicines and lifestyle changes to manage conditions such as:  Alcoholism.  High cholesterol.  Diabetes.  Being overweight or obese. Follow these instructions at home:   Do not drink alcohol. If you have trouble quitting, ask your health care provider how to safely quit with the help of medicine or a supervised program. This is important to keep your condition from getting worse.  Eat a healthy diet as told by your health care provider. Ask your health care provider about working with a diet and nutrition specialist (dietitian) to develop an eating plan.  Exercise regularly. This can help you lose weight and control your cholesterol and diabetes. Talk to your health care provider about an exercise plan and which activities are best for you.  Take over-the-counter and prescription medicines only as told by your health care provider.  Keep all follow-up visits as told by your health care provider. This is important. Contact a health care provider if: You have trouble controlling your:  Blood sugar. This is especially important if you have diabetes.  Cholesterol.  Drinking of alcohol. Get help right away if:  You have abdominal pain.  You have jaundice.  You have nausea and vomiting.  You vomit blood or material that looks like coffee grounds.  You have stools that are black, tar-like, or bloody. Summary  Fatty liver disease develops when too much fat builds up in the cells of your liver.  Fatty liver disease often causes no symptoms or problems. However, over   time, fatty liver can cause inflammation that may lead to more serious liver problems, such as scarring of the liver (cirrhosis).  You are more likely to develop this condition if you abuse alcohol, are pregnant, are overweight, have diabetes, have hepatitis, or have high triglyceride  levels.  Contact your health care provider if you have trouble controlling your weight, blood sugar, cholesterol, or drinking of alcohol. This information is not intended to replace advice given to you by your health care provider. Make sure you discuss any questions you have with your health care provider. Document Revised: 05/17/2017 Document Reviewed: 03/13/2017 Elsevier Patient Education  2020 Elsevier Inc.  

## 2019-09-02 ENCOUNTER — Other Ambulatory Visit: Payer: Self-pay | Admitting: Family Medicine

## 2019-09-02 DIAGNOSIS — I10 Essential (primary) hypertension: Secondary | ICD-10-CM

## 2019-09-08 DIAGNOSIS — R748 Abnormal levels of other serum enzymes: Secondary | ICD-10-CM | POA: Diagnosis not present

## 2019-09-08 DIAGNOSIS — K760 Fatty (change of) liver, not elsewhere classified: Secondary | ICD-10-CM | POA: Diagnosis not present

## 2019-09-13 LAB — COMPREHENSIVE METABOLIC PANEL
ALT: 42 IU/L (ref 0–44)
AST: 36 IU/L (ref 0–40)
Albumin/Globulin Ratio: 1.4 (ref 1.2–2.2)
Albumin: 4.3 g/dL (ref 4.0–5.0)
Alkaline Phosphatase: 116 IU/L (ref 39–117)
BUN/Creatinine Ratio: 14 (ref 9–20)
BUN: 11 mg/dL (ref 6–24)
Bilirubin Total: 0.4 mg/dL (ref 0.0–1.2)
CO2: 24 mmol/L (ref 20–29)
Calcium: 9.3 mg/dL (ref 8.7–10.2)
Chloride: 100 mmol/L (ref 96–106)
Creatinine, Ser: 0.8 mg/dL (ref 0.76–1.27)
GFR calc Af Amer: 125 mL/min/{1.73_m2} (ref >59)
GFR calc non Af Amer: 108 mL/min/{1.73_m2} (ref >59)
Globulin, Total: 3 g/dL (ref 1.5–4.5)
Glucose: 77 mg/dL (ref 65–99)
Potassium: 3.8 mmol/L (ref 3.5–5.2)
Sodium: 139 mmol/L (ref 134–144)
Total Protein: 7.3 g/dL (ref 6.0–8.5)

## 2019-09-13 LAB — PROTIME-INR
INR: 0.9 (ref 0.9–1.2)
Prothrombin Time: 10 s (ref 9.1–12.0)

## 2019-09-13 LAB — HEPATITIS B SURFACE ANTIBODY,QUALITATIVE: Hep B Surface Ab, Qual: NONREACTIVE

## 2019-09-13 LAB — MITOCHONDRIAL/SMOOTH MUSCLE AB PNL
Mitochondrial Ab: <20 Units (ref 0.0–20.0)
Smooth Muscle Ab: 6 Units (ref 0–19)

## 2019-09-13 LAB — HCV COMMENT:

## 2019-09-13 LAB — HEPATITIS A ANTIBODY, TOTAL: hep A Total Ab: POSITIVE — AB

## 2019-09-13 LAB — HEPATITIS B SURFACE ANTIGEN: Hepatitis B Surface Ag: NEGATIVE

## 2019-09-13 LAB — FERRITIN: Ferritin: 322 ng/mL (ref 30–400)

## 2019-09-13 LAB — ANA: ANA Titer 1: NEGATIVE

## 2019-09-13 LAB — ANTI-MICROSOMAL ANTIBODY LIVER / KIDNEY: LKM1 Ab: 1.1 Units (ref 0.0–20.0)

## 2019-09-13 LAB — HEPATITIS B CORE ANTIBODY, TOTAL: Hep B Core Total Ab: NEGATIVE

## 2019-09-13 LAB — IGG 4: IgG, Subclass 4: 94 mg/dL (ref 2–96)

## 2019-09-13 LAB — HEPATITIS C ANTIBODY (REFLEX): HCV Ab: <0.1 s/co ratio (ref 0.0–0.9)

## 2019-09-18 DIAGNOSIS — Z03818 Encounter for observation for suspected exposure to other biological agents ruled out: Secondary | ICD-10-CM | POA: Diagnosis not present

## 2019-09-18 DIAGNOSIS — Z20828 Contact with and (suspected) exposure to other viral communicable diseases: Secondary | ICD-10-CM | POA: Diagnosis not present

## 2019-09-27 ENCOUNTER — Other Ambulatory Visit: Payer: Self-pay | Admitting: Family Medicine

## 2019-09-27 DIAGNOSIS — I10 Essential (primary) hypertension: Secondary | ICD-10-CM

## 2019-09-27 NOTE — Telephone Encounter (Signed)
Requested Prescriptions  Pending Prescriptions Disp Refills  . atenolol-chlorthalidone (TENORETIC) 50-25 MG tablet [Pharmacy Med Name: ATENOLOL-CHLORTHALIDONE 50-25] 90 tablet 0    Sig: TAKE 1 TABLET BY MOUTH EVERY DAY     Cardiovascular: Beta Blocker + Diuretic Combos Failed - 09/27/2019 12:56 AM      Failed - Last BP in normal range    BP Readings from Last 1 Encounters:  07/14/19 136/90         Failed - Last Heart Rate in normal range    Pulse Readings from Last 1 Encounters:  07/14/19 (!) 115         Passed - K in normal range and within 180 days    Potassium  Date Value Ref Range Status  09/08/2019 3.8 3.5 - 5.2 mmol/L Final         Passed - Na in normal range and within 180 days    Sodium  Date Value Ref Range Status  09/08/2019 139 134 - 144 mmol/L Final         Passed - Cr in normal range and within 180 days    Creat  Date Value Ref Range Status  06/23/2018 0.73 0.60 - 1.35 mg/dL Final   Creatinine, Ser  Date Value Ref Range Status  09/08/2019 0.80 0.76 - 1.27 mg/dL Final         Passed - Ca in normal range and within 180 days    Calcium  Date Value Ref Range Status  09/08/2019 9.3 8.7 - 10.2 mg/dL Final         Passed - Patient is not pregnant      Passed - Valid encounter within last 6 months    Recent Outpatient Visits          2 months ago Right-sided chest wall pain   [facility] Al[facility] Cory, [facility]   3 months ago Essential hypertension   [facility] Alba Cory, MD   9 months ago Essential hypertension   [facility] Alba Cory, MD   12 months ago Mild recurrent major depression [facility])   [facility] Alba Cory, MD   1 year ago Mild recurrent major depression [facility])   [facility] Alba Cory, MD

## 2019-10-26 ENCOUNTER — Other Ambulatory Visit: Payer: Self-pay | Admitting: Family Medicine

## 2019-10-26 DIAGNOSIS — E559 Vitamin D deficiency, unspecified: Secondary | ICD-10-CM

## 2019-10-26 NOTE — Telephone Encounter (Signed)
Requested medication (s) are due for refill today: yes  Requested medication (s) are on the active medication list: yes  Last refill:  08/03/2019  Future visit scheduled: no  Notes to clinic:  50,000 IU strengths are not delegated   Requested Prescriptions  Pending Prescriptions Disp Refills   Vitamin D, Ergocalciferol, (DRISDOL) 1.25 MG (50000 UNIT) CAPS capsule [Pharmacy Med Name: VITAMIN D2 1.25MG (50,000 UNIT)] 12 capsule 0    Sig: Take 1 capsule (50,000 Units total) by mouth once a week. For 12 weeks      Endocrinology:  Vitamins - Vitamin D Supplementation Failed - 10/26/2019  1:28 AM      Failed - 50,000 IU strengths are not delegated      Failed - Phosphate in normal range and within 360 days    No results found for: PHOS        Passed - Ca in normal range and within 360 days    Calcium  Date Value Ref Range Status  09/08/2019 9.3 8.7 - 10.2 mg/dL Final          Passed - Vitamin D in normal range and within 360 days    Vit D, 25-Hydroxy  Date Value Ref Range Status  07/29/2019 47.6  Final          Passed - Valid encounter within last 12 months    Recent Outpatient Visits           3 months ago Right-sided chest wall pain   [facility] Al[facility] Cory, MD   4 months ago Essential hypertension   [facility] Alba Cory, MD   10 months ago Essential hypertension   [facility] Alba Cory, MD   1 year ago Mild recurrent major depression [facility])   [facility] Alba Cory, MD   1 year ago Mild recurrent major depression [facility])   [facility] Alba Cory, MD

## 2019-12-29 ENCOUNTER — Other Ambulatory Visit: Payer: Self-pay | Admitting: Family Medicine

## 2019-12-29 DIAGNOSIS — I10 Essential (primary) hypertension: Secondary | ICD-10-CM

## 2019-12-29 NOTE — Telephone Encounter (Signed)
Requested Prescriptions  Pending Prescriptions Disp Refills   atenolol-chlorthalidone (TENORETIC) 50-25 MG tablet [Pharmacy Med Name: ATENOLOL-CHLORTHALIDONE 50-25] 90 tablet 0    Sig: TAKE 1 TABLET BY MOUTH EVERY DAY     Cardiovascular: Beta Blocker + Diuretic Combos Failed - 12/29/2019  1:44 AM      Failed - Last BP in normal range    BP Readings from Last 1 Encounters:  07/14/19 136/90         Failed - Last Heart Rate in normal range    Pulse Readings from Last 1 Encounters:  07/14/19 (!) 115         Passed - K in normal range and within 180 days    Potassium  Date Value Ref Range Status  09/08/2019 3.8 3.5 - 5.2 mmol/L Final         Passed - Na in normal range and within 180 days    Sodium  Date Value Ref Range Status  09/08/2019 139 134 - 144 mmol/L Final         Passed - Cr in normal range and within 180 days    Creat  Date Value Ref Range Status  06/23/2018 0.73 0.60 - 1.35 mg/dL Final   Creatinine, Ser  Date Value Ref Range Status  09/08/2019 0.80 0.76 - 1.27 mg/dL Final         Passed - Ca in normal range and within 180 days    Calcium  Date Value Ref Range Status  09/08/2019 9.3 8.7 - 10.2 mg/dL Final         Passed - Patient is not pregnant      Passed - Valid encounter within last 6 months    Recent Outpatient Visits          5 months ago Right-sided chest wall pain   [facility] Al[facility] Cory, MD   6 months ago Essential hypertension   [facility] Al[facility], MD   1 year ago Essential hypertension   [facility] Alba Cory, MD   1 year ago Mild recurrent major depression [facility])   [facility] Alba Cory, MD   1 year ago Mild recurrent major depression [facility])   [facility] Alba Cory, MD             Reminder for patient to schedule an office visit with this refill

## 2020-01-13 ENCOUNTER — Other Ambulatory Visit: Payer: Self-pay | Admitting: Family Medicine

## 2020-01-13 DIAGNOSIS — E559 Vitamin D deficiency, unspecified: Secondary | ICD-10-CM

## 2020-01-13 NOTE — Telephone Encounter (Signed)
Requested medications are due for refill today?  Yes - Strengths of 50,000 IU cannot be delegated.    Requested medications are on active medication list?  Yes  Last Refill:   10/26/2019  # 12 with no refills  Future visit scheduled?  No   Notes to Clinic:  Strengths of 50,000 IU cannot be delegated.

## 2020-03-09 NOTE — Progress Notes (Signed)
Name: Danny Chase   MRN: [id number]    DOB: 1976/01/04   Date:03/10/2020       Progress Note  Subjective  Chief Complaint  CPE   HPI  Patient presents for annual CPE and follow up  Alcohol use: AUDIT positive, took medication to help him quit in the past but stopped on his own, discussed AA meetings but he said no. Heavy drinker for about 21 years. He states he continues to drink red wine one bottle per day.   Mild Major Depression: sees Dr. Evelene Croon in [place] about once a year . Heis now on Pristiq  50 mg and Trazodone but only takes it prn. He states he has been stable, he has been playing disc golf.   HTN: the is on Tenoretic , compliant with medication, he has been physically active, bp is elevated today, but he states he is doing much at home, recently bp at home still shows elevated DBP  Hypertriglyceridemia:  from  700 to high 400's He has not been very compliant with Vascepa, he will recheck labs at work in January  Elevated lft's: seen by Dr. Tawni Carnes and had labs done, reminded him to stop drinking , labs normal    IPSS Questionnaire (AUA-7): Over the past month.   1)  How often have you had a sensation of not emptying your bladder completely after you finish urinating?  never  2)  How often have you had to urinate again less than two hours after you finished urinating? sometimes  3)  How often have you found you stopped and started again several times when you urinated?  never  4) How difficult have you found it to postpone urination?  never  5) How often have you had a weak urinary stream?  never  6) How often have you had to push or strain to begin urination?  never  7) How many times did you most typically get up to urinate from the time you went to bed until the time you got up in the morning?  occassionaly  Total score:  0-7 mildly symptomatic   8-19 moderately symptomatic   20-35 severely symptomatic     Diet: he state it could be better.  Exercise:  continue regular physical activity   Depression: phq 9 is negative Depression screen [facility] 2/9 03/10/2020 07/14/2019 06/25/2019 12/23/2018 09/29/2018  Decreased Interest 0 0 0 0 0  Down, Depressed, Hopeless 0 0 0 0 0  PHQ - 2 Score 0 0 0 0 0  Altered sleeping 0 1 0 1 3  Tired, decreased energy 0 0 0 0 0  Change in appetite 0 0 0 0 0  Feeling bad or failure about yourself  0 0 0 0 0  Trouble concentrating 0 0 0 0 0  Moving slowly or fidgety/restless 0 0 0 0 0  Suicidal thoughts 0 0 0 0 0  PHQ-9 Score 0 1 0 1 3  Difficult doing work/chores - - - Not difficult at all Not difficult at all  Some recent data might be hidden    Hypertension:  BP Readings from Last 3 Encounters:  03/10/20 (!) 140/100  07/14/19 136/90  06/25/19 (!) 141/99    Obesity: Wt Readings from Last 3 Encounters:  03/10/20 197 lb 4.8 oz (89.5 kg)  07/14/19 200 lb 1.6 oz (90.8 kg)  12/23/18 202 lb 8 oz (91.9 kg)   BMI Readings from Last 3 Encounters:  03/10/20 27.52 kg/m  07/14/19 27.91 kg/m  12/23/18 28.24 kg/m     Lipids:  Lab Results  Component Value Date   CHOL 207 (A) 07/29/2019   CHOL 250 (A) 03/14/2019   CHOL 245 (H) 06/23/2018   Lab Results  Component Value Date   HDL 48 07/29/2019   HDL 54 03/14/2019   HDL 56 06/23/2018   Lab Results  Component Value Date   LDLCALC 80 07/29/2019   LDLCALC 107 03/14/2019   LDLCALC  06/23/2018     Comment:     . LDL cholesterol not calculated. Triglyceride levels greater than 400 mg/dL invalidate calculated LDL results. . Reference range: <100 . Desirable range <100 mg/dL for primary prevention;   <70 mg/dL for patients with CHD or diabetic patients  with > or = 2 CHD risk factors. Marland Kitchen LDL-C is now calculated using the Martin-Hopkins  calculation, which is a validated novel method providing  better accuracy than the Friedewald equation in the  estimation of LDL-C.  Horald Pollen et al. Lenox Ahr. 0867;619(50): 2061-2068   (http://education.[facility].com/faq/FAQ164)    Lab Results  Component Value Date   TRIG 498 (A) 07/29/2019   TRIG 518 (A) 03/14/2019   TRIG 748 (H) 06/23/2018   Lab Results  Component Value Date   CHOLHDL 4.4 06/23/2018   CHOLHDL 2.8 12/13/2016   No results found for: LDLDIRECT Glucose:  Glucose  Date Value Ref Range Status  09/08/2019 77 65 - 99 mg/dL Final   Glucose, Bld  Date Value Ref Range Status  06/23/2018 83 65 - 99 mg/dL Final    Comment:    .            Fasting reference interval .   12/13/2016 82 65 - 99 mg/dL Final      [facility]  AUDIT-C Score 7     He states trying to cut down on alcohol   Single STD testing and prevention (HIV/chl/gon/syphilis): not interested  Hep C: completed 09/08/19  Skin cancer: Discussed monitoring for atypical lesions Colorectal cancer: discussed options, he would prefer cologuard  Prostate cancer: completed  Lab Results  Component Value Date   PSA 0.6 07/29/2019   PSA 0.6 12/13/2016      Vaccines:   Flu: today, educated and discussed with patient.  Advanced Care Planning: A voluntary discussion about advance care planning including the explanation and discussion of advance directives.  Discussed health care proxy and Living will, and the patient was able to identify a health care proxy as mother .  Patient does not  have a living will at present time.  Patient Active Problem List   Diagnosis Date Noted  . Thrombocytopenia (HCC) 06/23/2018  . Perennial allergic rhinitis with seasonal variation 12/13/2017  . Vitamin D deficiency 12/13/2017  . Alcohol use disorder, moderate, dependence (HCC) 12/13/2017  . Psoriasis 12/13/2017  . Mild recurrent major depression (HCC) 07/13/2015  . Insomnia 07/13/2015  . HTN (hypertension) 07/13/2015    History reviewed. No pertinent surgical history.  Family History  Problem Relation Age of Onset  . Cancer Mother         Lymphoma  . Stroke Mother   . Cancer Father        Lymphoma, Colon CA  . Hypertension Brother     Social History   Socioeconomic History  . Marital status: Single    Spouse name: Not on file  . Number of children: 1  . Years of education: Not on file  . Highest education  level: Bachelor's degree (e.g., BA, AB, BS)  Occupational History  . Occupation: Charity fundraiser     Comment: Licensed conveyancer   Tobacco Use  . Smoking status: Never Smoker  . Smokeless tobacco: Never Used  Vaping Use  . Vaping Use: Never used  Substance and Sexual Activity  . Alcohol use: Yes    Alcohol/week: 7.0 standard drinks    Types: 7 Glasses of wine per week  . Drug use: No  . Sexual activity: Yes  Other Topics Concern  . Not on file  Social History Narrative   He lives alone, has a son, sees his son every weekend also brother and parents weekly   He plays guitar    Social Determinants of Corporate investment banker Strain: Low Risk   . Difficulty of Paying Living Expenses: Not hard at all  Food Insecurity: No Food Insecurity  . Worried About Programme researcher, broadcasting/film/video in the Last Year: Never true  . Ran Out of Food in the Last Year: Never true  Transportation Needs: No Transportation Needs  . Lack of Transportation (Medical): No  . Lack of Transportation (Non-Medical): No  Physical Activity: Sufficiently Active  . Days of Exercise per Week: 3 days  . Minutes of Exercise per Session: 70 min  Stress: No Stress Concern Present  . Feeling of Stress : Only a little  Social Connections: Moderately Isolated  . Frequency of Communication with Friends and Family: More than three times a week  . Frequency of Social Gatherings with Friends and Family: More than three times a week  . Attends Religious Services: Never  . Active Member of Clubs or Organizations: Yes  . Attends Banker Meetings: More than 4 times per year  . Marital Status: Never married  Intimate Partner Violence: Not At Risk  .  Fear of Current or Ex-Partner: No  . Emotionally Abused: No  . Physically Abused: No  . Sexually Abused: No     Current Outpatient Medications:  .  atenolol-chlorthalidone (TENORETIC) 50-25 MG tablet, Take 1 tablet by mouth daily., Disp: 90 tablet, Rfl: 1 .  cetirizine (ZYRTEC) 10 MG tablet, Take 10 mg by mouth at bedtime., Disp: , Rfl:  .  desvenlafaxine (PRISTIQ) 50 MG 24 hr tablet, Take 1 tablet (50 mg total) by mouth daily., Disp: 30 tablet, Rfl: 0 .  fluticasone (FLONASE) 50 MCG/ACT nasal spray, Place 2 sprays into both nostrils daily., Disp: , Rfl: 4 .  traZODone (DESYREL) 50 MG tablet, SMARTSIG:1 Tablet(s) By Mouth Every Night, Disp: , Rfl:  .  VASCEPA 1 g capsule, Take 2 capsules (2 g total) by mouth 2 (two) times daily., Disp: 360 capsule, Rfl: 1 .  Vitamin D, Ergocalciferol, (DRISDOL) 1.25 MG (50000 UNIT) CAPS capsule, Take 1 capsule (50,000 Units total) by mouth once a week. For 12 weeks, Disp: 12 capsule, Rfl: 0  No Known Allergies   ROS  Constitutional: Negative for fever or weight change.  Respiratory: Negative for cough and shortness of breath.   Cardiovascular: Negative for chest pain or palpitations.  Gastrointestinal: Negative for abdominal pain, no bowel changes.  Musculoskeletal: Negative for gait problem or joint swelling.  Skin: Negative for rash.  Neurological: Negative for dizziness or headache.  No other specific complaints in a complete review of systems (except as listed in HPI above).  Objective  Vitals:   03/10/20 1005  BP: (!) 140/100  Pulse: 85  Resp: 16  Temp: 98.6 F (37 C)  TempSrc:  Oral  SpO2: 99%  Weight: 197 lb 4.8 oz (89.5 kg)  Height: 5\' 11"  (1.803 m)    Body mass index is 27.52 kg/m.  Physical Exam  Constitutional: Patient appears well-developed and well-nourished. No distress.  HENT: Head: Normocephalic and atraumatic. Ears: B TMs ok, no erythema or effusion; Nose: Not done  Mouth/Throat: not done  Eyes: Conjunctivae and EOM  are normal. Pupils are equal, round, and reactive to light. No scleral icterus.  Neck: Normal range of motion. Neck supple. No JVD present. No thyromegaly present.  Cardiovascular: Normal rate, regular rhythm and normal heart sounds.  No murmur heard. No BLE edema. Pulmonary/Chest: Effort normal and breath sounds normal. No respiratory distress. Abdominal: Soft. Bowel sounds are normal, no distension. There is no tenderness. no masses MALE GENITALIA: Normal descended testes bilaterally, no masses palpated, no hernias, no lesions, no discharge RECTAL: not done  Musculoskeletal: Normal range of motion, no joint effusions. No gross deformities Neurological: he is alert and oriented to person, place, and time. No cranial nerve deficit. Coordination, balance, strength, speech and gait are normal.  Skin: Skin is warm and dry. No rash noted. No erythema.  Psychiatric: Patient has a normal mood and affect. behavior is normal. Judgment and thought content normal.   Fall Risk: Fall Risk  03/10/2020 07/14/2019 06/25/2019 12/23/2018 12/23/2018  Falls in the past year? 0 0 0 0 0  Number falls in past yr: 0 0 0 0 0  Injury with Fall? 0 0 0 0 0     Functional Status Survey: Is the patient deaf or have difficulty hearing?: No Does the patient have difficulty seeing, even when wearing glasses/contacts?: No Does the patient have difficulty concentrating, remembering, or making decisions?: No Does the patient have difficulty walking or climbing stairs?: No Does the patient have difficulty dressing or bathing?: No Does the patient have difficulty doing errands alone such as visiting a doctor's office or shopping?: No   Assessment & Plan  1. Encounter for annual physical exam   2. Screening for HIV without presence of risk factors  refused  3. Need for immunization against influenza  - Flu Vaccine QUAD 36+ mos IM  4. Colon cancer screening  He would like to try cologuard and not a colonoscopy   5.  Essential hypertension  Change in therapy, we will try Benicar hctz 40/25   6. Vitamin D insufficiency  - Vitamin D, Ergocalciferol, (DRISDOL) 1.25 MG (50000 UNIT) CAPS capsule; Take 1 capsule (50,000 Units total) by mouth once a week. For 12 weeks  Dispense: 12 capsule; Refill: 0  7. Need for hepatitis B vaccination  Out of stock, he will return in one to two weeks for bp check and hepatitis B   -Prostate cancer screening and PSA options (with potential risks and benefits of testing vs not testing) were discussed along with recent recs/guidelines. -USPSTF grade A and B recommendations reviewed with patient; age-appropriate recommendations, preventive care, screening tests, etc discussed and encouraged; healthy living encouraged; see AVS for patient education given to patient -Discussed importance of 150 minutes of physical activity weekly, eat two servings of fish weekly, eat one serving of tree nuts ( cashews, pistachios, pecans, almonds.Marland Kitchen.) every other day, eat 6 servings of fruit/vegetables daily and drink plenty of water and avoid sweet beverages.

## 2020-03-10 ENCOUNTER — Encounter: Payer: Self-pay | Admitting: Family Medicine

## 2020-03-10 ENCOUNTER — Ambulatory Visit (INDEPENDENT_AMBULATORY_CARE_PROVIDER_SITE_OTHER): Payer: BC Managed Care – PPO | Admitting: Family Medicine

## 2020-03-10 ENCOUNTER — Other Ambulatory Visit: Payer: Self-pay

## 2020-03-10 VITALS — BP 140/110 | HR 85 | Temp 98.6°F | Resp 16 | Ht 71.0 in | Wt 197.3 lb

## 2020-03-10 DIAGNOSIS — I10 Essential (primary) hypertension: Secondary | ICD-10-CM | POA: Diagnosis not present

## 2020-03-10 DIAGNOSIS — Z0000 Encounter for general adult medical examination without abnormal findings: Secondary | ICD-10-CM | POA: Diagnosis not present

## 2020-03-10 DIAGNOSIS — Z114 Encounter for screening for human immunodeficiency virus [HIV]: Secondary | ICD-10-CM | POA: Diagnosis not present

## 2020-03-10 DIAGNOSIS — Z23 Encounter for immunization: Secondary | ICD-10-CM

## 2020-03-10 DIAGNOSIS — E559 Vitamin D deficiency, unspecified: Secondary | ICD-10-CM

## 2020-03-10 DIAGNOSIS — Z1211 Encounter for screening for malignant neoplasm of colon: Secondary | ICD-10-CM

## 2020-03-10 MED ORDER — ATENOLOL-CHLORTHALIDONE 50-25 MG PO TABS: 1.0000 | ORAL_TABLET | Freq: Every day | ORAL | 1 refills | Status: DC

## 2020-03-10 MED ORDER — VASCEPA 1 G PO CAPS: 2.0000 g | ORAL_CAPSULE | Freq: Two times a day (BID) | ORAL | 1 refills | Status: DC

## 2020-03-10 MED ORDER — OLMESARTAN MEDOXOMIL-HCTZ 40-25 MG PO TABS: 1.0000 | ORAL_TABLET | Freq: Every day | ORAL | 1 refills | Status: DC

## 2020-03-10 MED ORDER — VITAMIN D (ERGOCALCIFEROL) 1.25 MG (50000 UNIT) PO CAPS: 50000.0000 [IU] | ORAL_CAPSULE | ORAL | 0 refills | Status: DC

## 2020-03-10 NOTE — Patient Instructions (Signed)
Preventive Care 41-46 Years Old, Male Preventive care refers to lifestyle choices and visits with your health care provider that can promote health and wellness. This includes:  A yearly physical exam. This is also called an annual well check.  Regular dental and eye exams.  Immunizations.  Screening for certain conditions.  Healthy lifestyle choices, such as eating a healthy diet, getting regular exercise, not using drugs or products that contain nicotine and tobacco, and limiting alcohol use. What can I expect for my preventive care visit? Physical exam Your health care provider will check:  Height and weight. These may be used to calculate body mass index (BMI), which is a measurement that tells if you are at a healthy weight.  Heart rate and blood pressure.  Your skin for abnormal spots. Counseling Your health care provider may ask you questions about:  Alcohol, tobacco, and drug use.  Emotional well-being.  Home and relationship well-being.  Sexual activity.  Eating habits.  Work and work Statistician. What immunizations do I need?  Influenza (flu) vaccine  This is recommended every year. Tetanus, diphtheria, and pertussis (Tdap) vaccine  You may need a Td booster every 10 years. Varicella (chickenpox) vaccine  You may need this vaccine if you have not already been vaccinated. Zoster (shingles) vaccine  You may need this after age 64. Measles, mumps, and rubella (MMR) vaccine  You may need at least one dose of MMR if you were born in 1957 or later. You may also need a second dose. Pneumococcal conjugate (PCV13) vaccine  You may need this if you have certain conditions and were not previously vaccinated. Pneumococcal polysaccharide (PPSV23) vaccine  You may need one or two doses if you smoke cigarettes or if you have certain conditions. Meningococcal conjugate (MenACWY) vaccine  You may need this if you have certain conditions. Hepatitis A  vaccine  You may need this if you have certain conditions or if you travel or work in places where you may be exposed to hepatitis A. Hepatitis B vaccine  You may need this if you have certain conditions or if you travel or work in places where you may be exposed to hepatitis B. Haemophilus influenzae type b (Hib) vaccine  You may need this if you have certain risk factors. Human papillomavirus (HPV) vaccine  If recommended by your health care provider, you may need three doses over 6 months. You may receive vaccines as individual doses or as more than one vaccine together in one shot (combination vaccines). Talk with your health care provider about the risks and benefits of combination vaccines. What tests do I need? Blood tests  Lipid and cholesterol levels. These may be checked every 5 years, or more frequently if you are over 60 years old.  Hepatitis C test.  Hepatitis B test. Screening  Lung cancer screening. You may have this screening every year starting at age 43 if you have a 30-pack-year history of smoking and currently smoke or have quit within the past 15 years.  Prostate cancer screening. Recommendations will vary depending on your family history and other risks.  Colorectal cancer screening. All adults should have this screening starting at age 72 and continuing until age 2. Your health care provider may recommend screening at age 14 if you are at increased risk. You will have tests every 1-10 years, depending on your results and the type of screening test.  Diabetes screening. This is done by checking your blood sugar (glucose) after you have not eaten  for a while (fasting). You may have this done every 1-3 years.  Sexually transmitted disease (STD) testing. Follow these instructions at home: Eating and drinking  Eat a diet that includes fresh fruits and vegetables, whole grains, lean protein, and low-fat dairy products.  Take vitamin and mineral supplements as  recommended by your health care provider.  Do not drink alcohol if your health care provider tells you not to drink.  If you drink alcohol: ? Limit how much you have to 0-2 drinks a day. ? Be aware of how much alcohol is in your drink. In the [place] one drink equals one 12 oz bottle of beer (355 mL), one 5 oz glass of wine (148 mL), or one 1 oz glass of hard liquor (44 mL). Lifestyle  Take daily care of your teeth and gums.  Stay active. Exercise for at least 30 minutes on 5 or more days each week.  Do not use any products that contain nicotine or tobacco, such as cigarettes, e-cigarettes, and chewing tobacco. If you need help quitting, ask your health care provider.  If you are sexually active, practice safe sex. Use a condom or other form of protection to prevent STIs (sexually transmitted infections).  Talk with your health care provider about taking a low-dose aspirin every day starting at age 53. What's next?  Go to your health care provider once a year for a well check visit.  Ask your health care provider how often you should have your eyes and teeth checked.  Stay up to date on all vaccines. This information is not intended to replace advice given to you by your health care provider. Make sure you discuss any questions you have with your health care provider. Document Revised: 05/29/2018 Document Reviewed: 05/29/2018 Elsevier Patient Education  2020 [employer].

## 2020-03-17 ENCOUNTER — Ambulatory Visit: Payer: BC Managed Care – PPO

## 2020-03-24 ENCOUNTER — Ambulatory Visit: Payer: BC Managed Care – PPO

## 2020-04-22 ENCOUNTER — Ambulatory Visit: Payer: BC Managed Care – PPO

## 2020-04-28 ENCOUNTER — Ambulatory Visit (INDEPENDENT_AMBULATORY_CARE_PROVIDER_SITE_OTHER): Payer: BC Managed Care – PPO | Admitting: Emergency Medicine

## 2020-04-28 ENCOUNTER — Other Ambulatory Visit: Payer: Self-pay

## 2020-04-28 VITALS — BP 136/82 | HR 97 | Resp 18

## 2020-04-28 DIAGNOSIS — R748 Abnormal levels of other serum enzymes: Secondary | ICD-10-CM | POA: Diagnosis not present

## 2020-05-18 DIAGNOSIS — F3342 Major depressive disorder, recurrent, in full remission: Secondary | ICD-10-CM | POA: Diagnosis not present

## 2020-07-01 ENCOUNTER — Other Ambulatory Visit: Payer: Self-pay

## 2020-07-01 ENCOUNTER — Ambulatory Visit (INDEPENDENT_AMBULATORY_CARE_PROVIDER_SITE_OTHER): Payer: BC Managed Care – PPO

## 2020-07-01 DIAGNOSIS — Z23 Encounter for immunization: Secondary | ICD-10-CM | POA: Diagnosis not present

## 2020-07-23 ENCOUNTER — Other Ambulatory Visit: Payer: Self-pay | Admitting: Family Medicine

## 2020-07-23 DIAGNOSIS — I10 Essential (primary) hypertension: Secondary | ICD-10-CM

## 2020-07-23 DIAGNOSIS — E559 Vitamin D deficiency, unspecified: Secondary | ICD-10-CM

## 2020-07-23 NOTE — Telephone Encounter (Signed)
Requested medication (s) are due for refill today: yes  Requested medication (s) are on the active medication list: yes  Last refill:  03/15/20  Future visit scheduled: no  Notes to clinic:  not delegated    Requested Prescriptions  Pending Prescriptions Disp Refills   Vitamin D, Ergocalciferol, (DRISDOL) 1.25 MG (50000 UNIT) CAPS capsule [Pharmacy Med Name: VITAMIN D2 1.25MG (50,000 UNIT)] 12 capsule 0    Sig: TAKE 1 CAPSULE (50,000 UNITS TOTAL) BY MOUTH ONCE A WEEK. FOR 12 WEEKS      Endocrinology:  Vitamins - Vitamin D Supplementation Failed - 07/23/2020  9:42 AM      Failed - 50,000 IU strengths are not delegated      Failed - Phosphate in normal range and within 360 days    No results found for: PHOS        Passed - Ca in normal range and within 360 days    Calcium  Date Value Ref Range Status  09/08/2019 9.3 8.7 - 10.2 mg/dL Final          Passed - Vitamin D in normal range and within 360 days    Vit D, 25-Hydroxy  Date Value Ref Range Status  07/29/2019 47.6  Final          Passed - Valid encounter within last 12 months    Recent Outpatient Visits           4 months ago Essential hypertension   [facility] [place], Danna Hefty, MD   1 year ago Right-sided chest wall pain   [facility] [place], Danna Hefty, MD   1 year ago Essential hypertension   [facility] [place], Danna Hefty, MD   1 year ago Essential hypertension   [facility], [facility]   1 year ago Mild recurrent major depression [facility])   [facility] Alba Cory, MD                 Refused Prescriptions Disp Refills   olmesartan-hydrochlorothiazide (BENICAR HCT) 40-25 MG tablet [Pharmacy Med Name: OLMESARTAN-HCTZ 40-25 MG TAB] 90 tablet 1    Sig: TAKE 1 TABLET BY MOUTH EVERY DAY      Cardiovascular: ARB + Diuretic Combos Failed - 07/23/2020  9:42 AM      Failed - K in normal range and within 180  days    Potassium  Date Value Ref Range Status  09/08/2019 3.8 3.5 - 5.2 mmol/L Final          Failed - Na in normal range and within 180 days    Sodium  Date Value Ref Range Status  09/08/2019 139 134 - 144 mmol/L Final          Failed - Cr in normal range and within 180 days    Creat  Date Value Ref Range Status  06/23/2018 0.73 0.60 - 1.35 mg/dL Final   Creatinine, Ser  Date Value Ref Range Status  09/08/2019 0.80 0.76 - 1.27 mg/dL Final          Failed - Ca in normal range and within 180 days    Calcium  Date Value Ref Range Status  09/08/2019 9.3 8.7 - 10.2 mg/dL Final          Passed - Patient is not pregnant      Passed - Last BP in normal range    BP Readings from Last 1 Encounters:  04/28/20 136/82  Passed - Valid encounter within last 6 months    Recent Outpatient Visits           4 months ago Essential hypertension   [facility], [facility]   1 year ago Right-sided chest wall pain   [facility], [facility]   1 year ago Essential hypertension   [facility] Alba [facility], MD   1 year ago Essential hypertension   [facility], [facility]   1 year ago Mild recurrent major depression [facility])   [facility] Alba Cory, MD

## 2020-07-25 ENCOUNTER — Other Ambulatory Visit: Payer: Self-pay

## 2020-10-07 ENCOUNTER — Other Ambulatory Visit: Payer: Self-pay | Admitting: Family Medicine

## 2020-10-07 DIAGNOSIS — E559 Vitamin D deficiency, unspecified: Secondary | ICD-10-CM

## 2020-10-07 DIAGNOSIS — I10 Essential (primary) hypertension: Secondary | ICD-10-CM

## 2020-10-07 NOTE — Telephone Encounter (Signed)
Requested medications are due for refill today yes  Requested medications are on the active medication list yes  Last refill 9/28  Last visit 02/2020  Future visit scheduled no  Notes to clinic Not Delegated.

## 2020-12-08 DIAGNOSIS — Z0189 Encounter for other specified special examinations: Secondary | ICD-10-CM | POA: Diagnosis not present

## 2020-12-09 LAB — LIPID PANEL
Cholesterol: 199 (ref 0–200)
HDL: 40 (ref 35–70)
LDL Cholesterol: 11
LDL Cholesterol: 111
Triglycerides: 280 — AB (ref 40–160)

## 2020-12-09 LAB — TSH: TSH: 4.94 (ref <=5.90)

## 2020-12-09 LAB — CBC AND DIFFERENTIAL
HCT: 43 (ref 41–53)
Hemoglobin: 14.9 (ref 13.5–17.5)
Platelets: 229 (ref 150–399)
WBC: 7.8

## 2020-12-09 LAB — BASIC METABOLIC PANEL
BUN: 17 (ref 4–21)
Chloride: 95 — AB (ref 99–108)
Glucose: 84
Potassium: 4.2 (ref 3.4–5.3)
Sodium: 132 — AB (ref 137–147)

## 2020-12-09 LAB — HEPATIC FUNCTION PANEL
ALT: 18 (ref 10–40)
AST: 16 (ref 14–40)
Alkaline Phosphatase: 128 — AB (ref 25–125)
Bilirubin, Total: 0.4

## 2020-12-09 LAB — COMPREHENSIVE METABOLIC PANEL
Albumin: 4.4 (ref 3.5–5.0)
Calcium: 9.4 (ref 8.7–10.7)
Globulin: 3.1

## 2020-12-09 LAB — HEMOGLOBIN A1C: Hemoglobin A1C: 5.1

## 2020-12-09 LAB — CBC: RBC: 4.84 (ref 3.87–5.11)

## 2020-12-09 LAB — PSA: PSA: 0.5

## 2020-12-09 LAB — VITAMIN D 25 HYDROXY (VIT D DEFICIENCY, FRACTURES): Vit D, 25-Hydroxy: 42.6

## 2020-12-12 DIAGNOSIS — Z713 Dietary counseling and surveillance: Secondary | ICD-10-CM | POA: Diagnosis not present

## 2020-12-12 DIAGNOSIS — I10 Essential (primary) hypertension: Secondary | ICD-10-CM | POA: Diagnosis not present

## 2020-12-12 DIAGNOSIS — Z043 Encounter for examination and observation following other accident: Secondary | ICD-10-CM | POA: Diagnosis not present

## 2020-12-14 ENCOUNTER — Encounter: Payer: Self-pay | Admitting: Family Medicine

## 2020-12-26 ENCOUNTER — Other Ambulatory Visit: Payer: Self-pay | Admitting: Family Medicine

## 2020-12-26 DIAGNOSIS — E559 Vitamin D deficiency, unspecified: Secondary | ICD-10-CM

## 2021-01-05 DIAGNOSIS — K219 Gastro-esophageal reflux disease without esophagitis: Secondary | ICD-10-CM | POA: Diagnosis not present

## 2021-01-05 DIAGNOSIS — H6121 Impacted cerumen, right ear: Secondary | ICD-10-CM | POA: Diagnosis not present

## 2021-01-05 DIAGNOSIS — R1310 Dysphagia, unspecified: Secondary | ICD-10-CM | POA: Diagnosis not present

## 2021-01-06 ENCOUNTER — Other Ambulatory Visit: Payer: Self-pay | Admitting: Family Medicine

## 2021-01-06 DIAGNOSIS — I10 Essential (primary) hypertension: Secondary | ICD-10-CM

## 2021-01-06 NOTE — Telephone Encounter (Signed)
Requested medications are due for refill today yes  Requested medications are on the active medication list yes  Last refill 4/22  Last visit 02/2020  Future visit scheduled no  Notes to clinic Has already had a curtesy refill and there is no upcoming appointment scheduled.

## 2021-01-06 NOTE — Telephone Encounter (Signed)
Made pt appt

## 2021-01-10 ENCOUNTER — Ambulatory Visit: Payer: BC Managed Care – PPO | Admitting: Unknown Physician Specialty

## 2021-01-31 ENCOUNTER — Other Ambulatory Visit: Payer: Self-pay | Admitting: Family Medicine

## 2021-01-31 DIAGNOSIS — I10 Essential (primary) hypertension: Secondary | ICD-10-CM

## 2021-01-31 NOTE — Telephone Encounter (Signed)
Requested medication (s) are due for refill today:  no  Requested medication (s) are on the active medication list: yes   Last refill:  01/06/2021  Future visit scheduled: no  Notes to clinic:  Patient has appt on 01/10/2021 and was canceled  Message has been sent for patient to contact office    Requested Prescriptions  Pending Prescriptions Disp Refills   olmesartan-hydrochlorothiazide (BENICAR HCT) 40-25 MG tablet [Pharmacy Med Name: OLMESARTAN-HCTZ 40-25 MG TAB] 30 tablet 0    Sig: TAKE 1 TABLET BY MOUTH EVERY DAY     Cardiovascular: ARB + Diuretic Combos Failed - 01/31/2021  2:34 PM      Failed - Na in normal range and within 180 days    Sodium  Date Value Ref Range Status  12/09/2020 132 (A) 137 - 147 Final          Failed - Cr in normal range and within 180 days    Creat  Date Value Ref Range Status  06/23/2018 0.73 0.60 - 1.35 mg/dL Final   Creatinine, Ser  Date Value Ref Range Status  09/08/2019 0.80 0.76 - 1.27 mg/dL Final          Failed - Valid encounter within last 6 months    Recent Outpatient Visits           10 months ago Essential hypertension   [facility] Al[facility], MD   1 year ago Right-sided chest wall pain   [facility], MD   1 year ago Essential hypertension   [facility] [place], Danna Hefty, MD   2 years ago Essential hypertension   [facility] [place], Danna Hefty, MD   2 years ago Mild recurrent major depression [facility])   [facility] [place], Danna Hefty, MD              Passed - K in normal range and within 180 days    Potassium  Date Value Ref Range Status  12/09/2020 4.2 3.4 - 5.3 Final          Passed - Ca in normal range and within 180 days    Calcium  Date Value Ref Range Status  12/09/2020 9.4 8.7 - 10.7 Final          Passed - Patient is not pregnant      Passed - Last BP in normal range    BP Readings from  Last 1 Encounters:  04/28/20 136/82

## 2021-02-17 IMAGING — US US ABDOMEN LIMITED
1 series · 14 of 25 positions shown · non-contrast
Comparison: None.

CLINICAL DATA: Right upper quadrant pain

EXAM:
ULTRASOUND ABDOMEN LIMITED RIGHT UPPER QUADRANT

[Series 1: us abdomen limited · 14 of 35 slices shown]
[im 1/35]
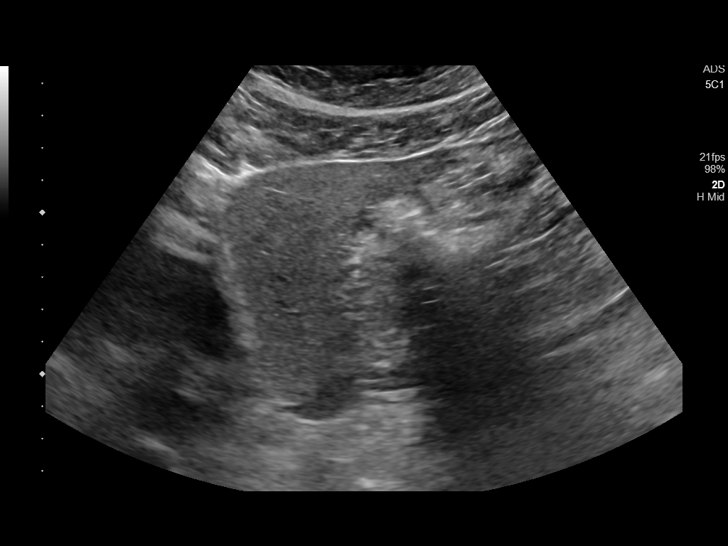
[im 3/35]
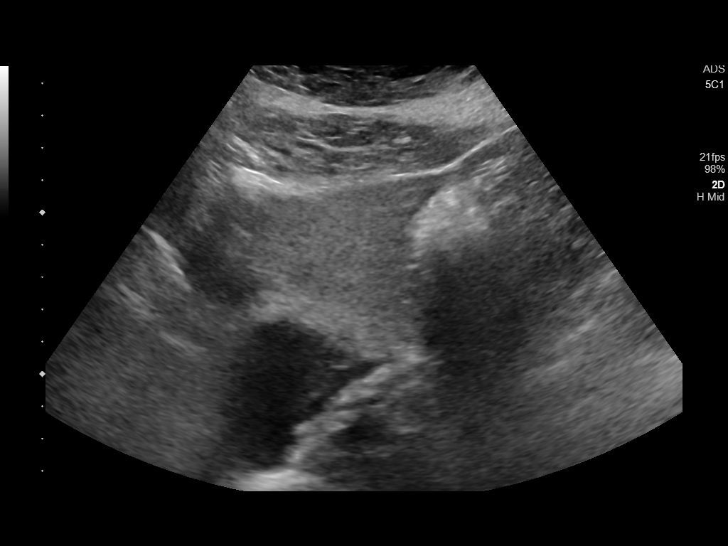
[im 6/35]
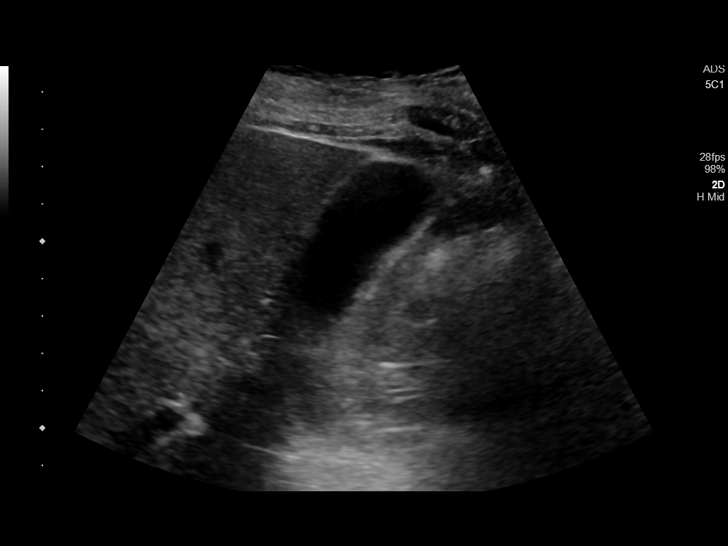
[im 9/35]
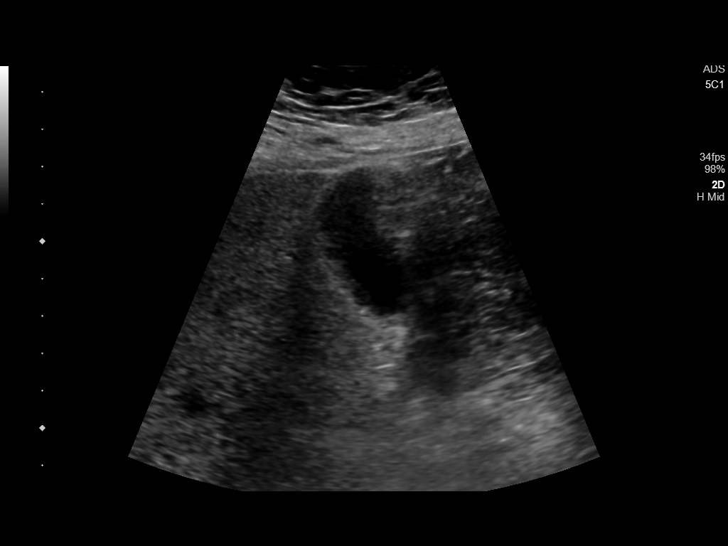
[im 12/35]
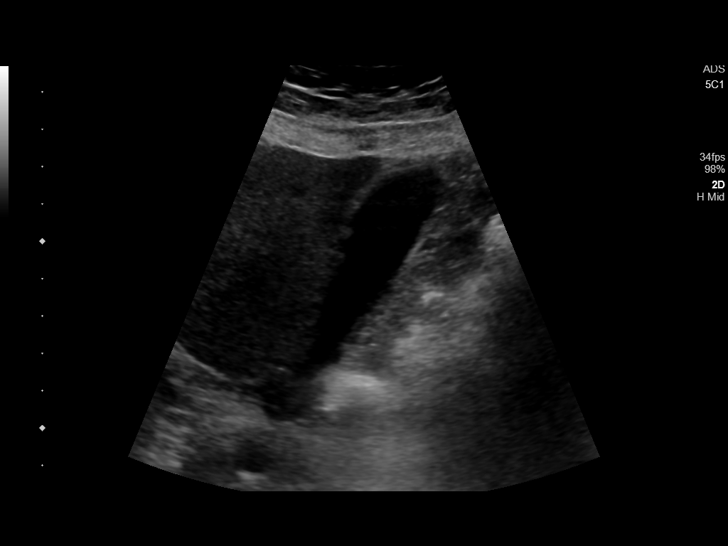
[im 13/35]
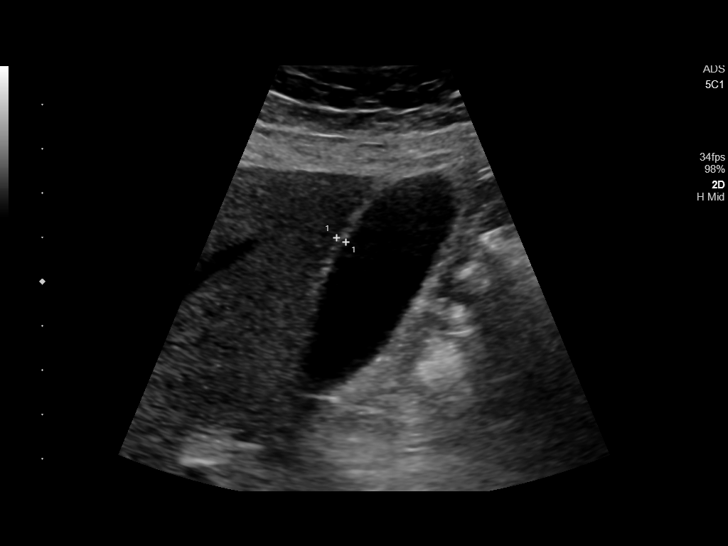
[im 16/35]
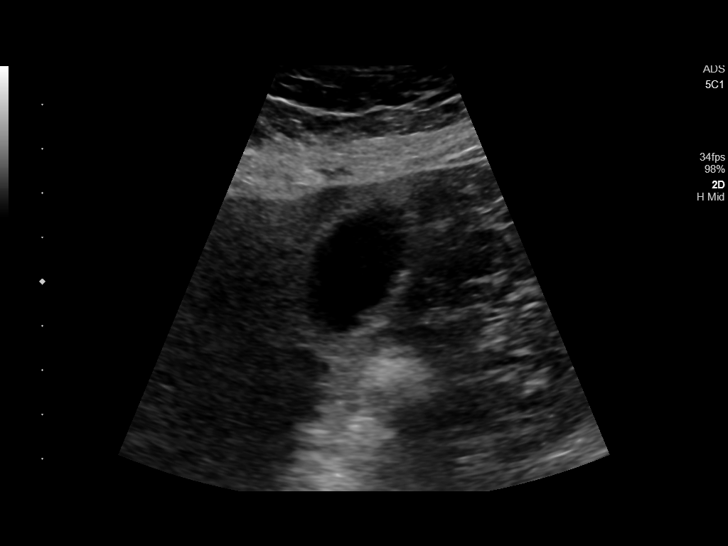
[im 19/35]
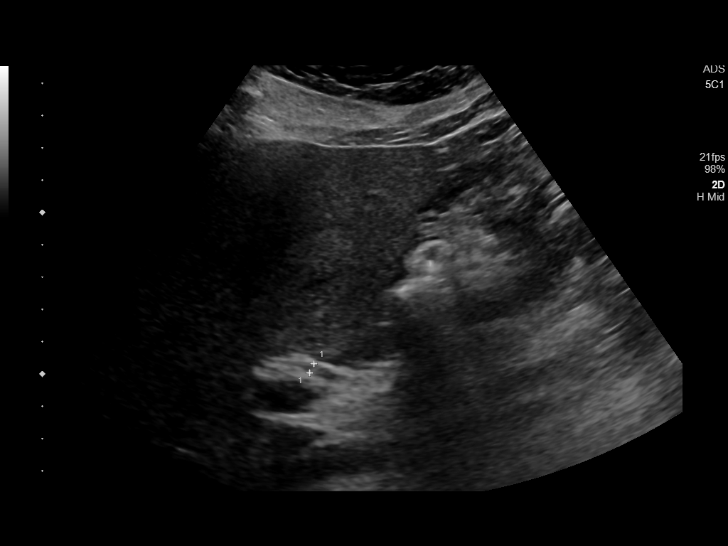
[im 22/35]
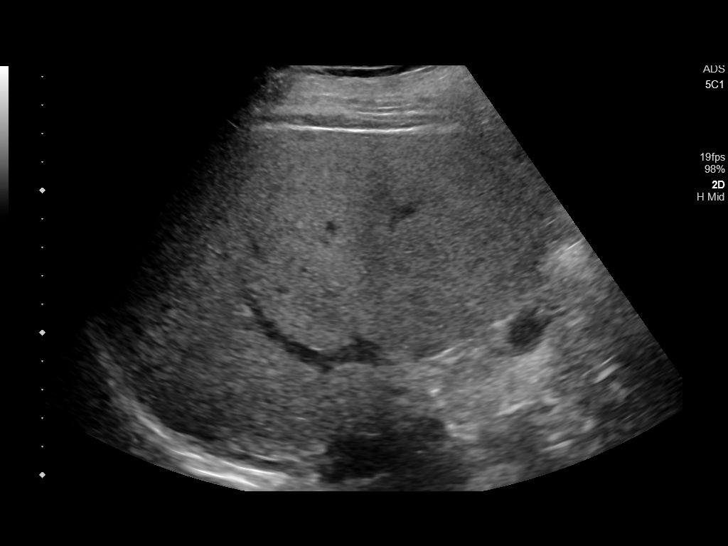
[im 23/35]
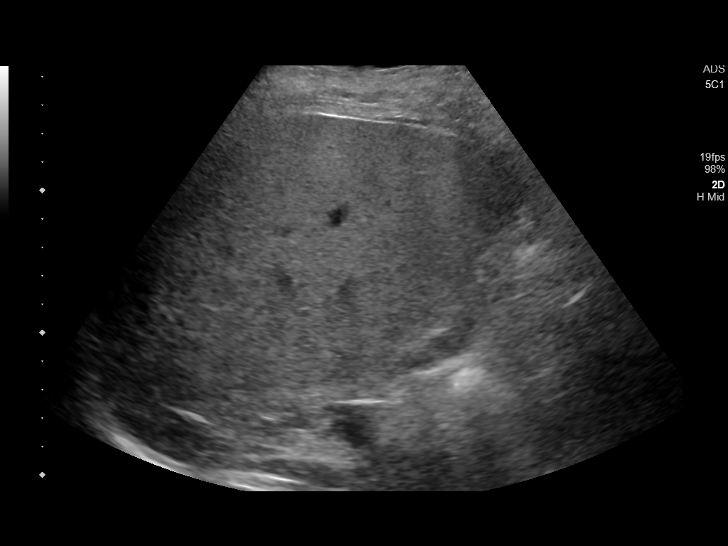
[im 26/35]
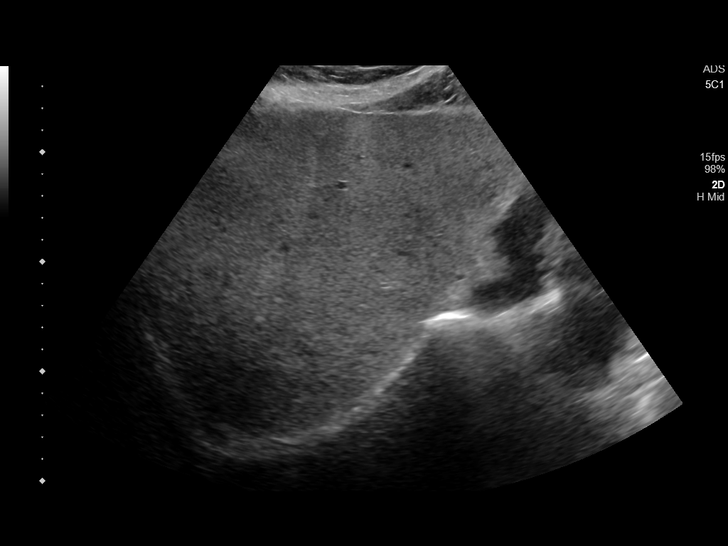
[im 29/35]
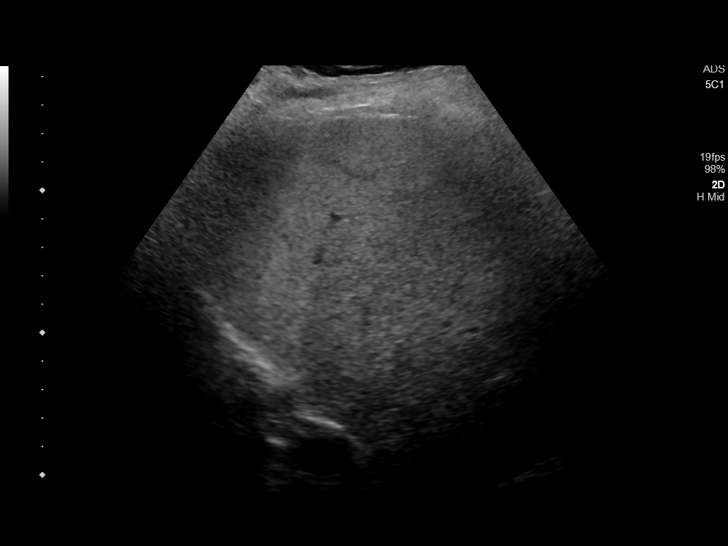
[im 32/35]
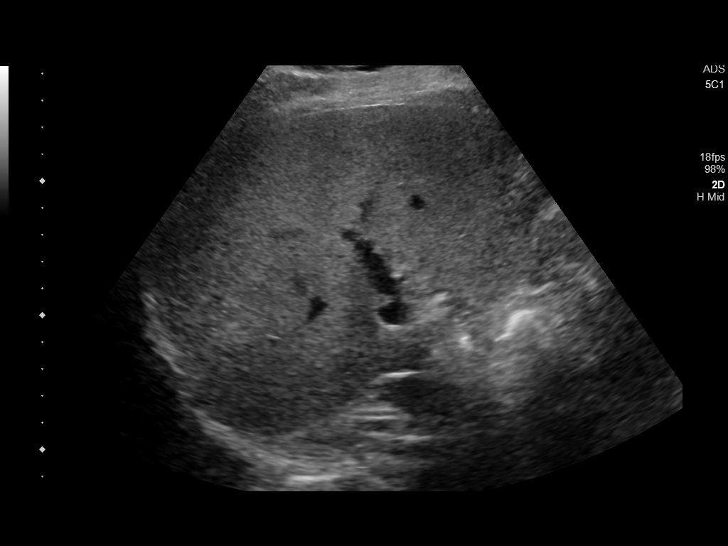
[im 35/35]
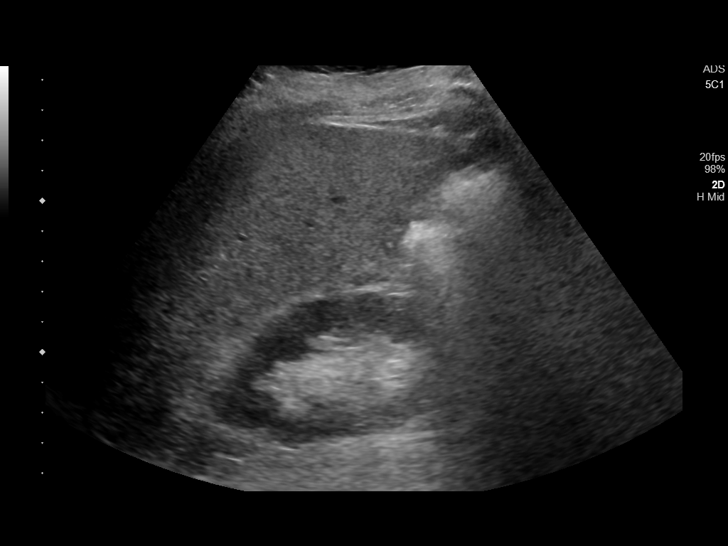

[14 of 25 positions shown; findings below may reference images not displayed]

FINDINGS: Gallbladder:

No gallstones or wall thickening visualized. There is no
pericholecystic fluid. No sonographic Murphy sign noted by
sonographer.

Common bile duct:

Diameter: 3 mm. No intrahepatic or extrahepatic biliary duct
dilatation.

Liver:

No focal lesion identified. Liver echogenicity overall is increased.
There are areas suggesting relative fatty sparing toward the dome of
the liver. Portal vein is patent on color Doppler imaging with
normal direction of blood flow towards the liver.

Other: None.
IMPRESSION: Increased liver echogenicity, a finding indicative of hepatic
steatosis. Suspect a degree of patchy fatty sparing peripherally. No
focal liver lesions evident.

Study otherwise unremarkable.

## 2021-02-26 ENCOUNTER — Other Ambulatory Visit: Payer: Self-pay | Admitting: Family Medicine

## 2021-02-26 DIAGNOSIS — I10 Essential (primary) hypertension: Secondary | ICD-10-CM

## 2021-02-26 NOTE — Telephone Encounter (Signed)
Requested medication (s) are due for refill today: yes  Requested medication (s) are on the active medication list: yes  Last refill:  01/06/21 #30  Future visit scheduled: no  Notes to clinic:  Called pt and LM on VM to call back and schedule appt - call back number provided   Requested Prescriptions  Pending Prescriptions Disp Refills   olmesartan-hydrochlorothiazide (BENICAR HCT) 40-25 MG tablet [Pharmacy Med Name: OLMESARTAN-HCTZ 40-25 MG TAB] 30 tablet 0    Sig: TAKE 1 TABLET BY MOUTH EVERY DAY     Cardiovascular: ARB + Diuretic Combos Failed - 02/26/2021  5:04 PM      Failed - Na in normal range and within 180 days    Sodium  Date Value Ref Range Status  12/09/2020 132 (A) 137 - 147 Final          Failed - Cr in normal range and within 180 days    Creat  Date Value Ref Range Status  06/23/2018 0.73 0.60 - 1.35 mg/dL Final   Creatinine, Ser  Date Value Ref Range Status  09/08/2019 0.80 0.76 - 1.27 mg/dL Final          Failed - Valid encounter within last 6 months    Recent Outpatient Visits           11 months ago Essential hypertension   [facility], MD   1 year ago Right-sided chest wall pain   [facility] Al[facility], MD   1 year ago Essential hypertension   [facility] [place], Danna Hefty, MD   2 years ago Essential hypertension   [facility], Danna Hefty, MD   2 years ago Mild recurrent major depression [facility])   [facility] [place], Danna Hefty, MD              Passed - K in normal range and within 180 days    Potassium  Date Value Ref Range Status  12/09/2020 4.2 3.4 - 5.3 Final          Passed - Ca in normal range and within 180 days    Calcium  Date Value Ref Range Status  12/09/2020 9.4 8.7 - 10.7 Final          Passed - Patient is not pregnant      Passed - Last BP in normal range    BP Readings from Last 1 Encounters:   04/28/20 136/82

## 2021-02-27 NOTE — Telephone Encounter (Signed)
Last seen 9.23.2021 nothing scheduled

## 2021-02-28 NOTE — Telephone Encounter (Signed)
Lvm to sch appt °

## 2021-03-01 ENCOUNTER — Encounter: Payer: Self-pay | Admitting: Family Medicine

## 2021-03-01 ENCOUNTER — Other Ambulatory Visit: Payer: Self-pay

## 2021-03-01 ENCOUNTER — Ambulatory Visit: Payer: BC Managed Care – PPO | Admitting: Family Medicine

## 2021-03-01 VITALS — BP 122/76 | HR 96 | Temp 98.5°F | Resp 16 | Ht 73.0 in | Wt 201.8 lb

## 2021-03-01 DIAGNOSIS — E559 Vitamin D deficiency, unspecified: Secondary | ICD-10-CM | POA: Diagnosis not present

## 2021-03-01 DIAGNOSIS — I10 Essential (primary) hypertension: Secondary | ICD-10-CM | POA: Diagnosis not present

## 2021-03-01 DIAGNOSIS — Z23 Encounter for immunization: Secondary | ICD-10-CM | POA: Diagnosis not present

## 2021-03-01 DIAGNOSIS — E871 Hypo-osmolality and hyponatremia: Secondary | ICD-10-CM

## 2021-03-01 DIAGNOSIS — R748 Abnormal levels of other serum enzymes: Secondary | ICD-10-CM

## 2021-03-01 DIAGNOSIS — E781 Pure hyperglyceridemia: Secondary | ICD-10-CM | POA: Diagnosis not present

## 2021-03-01 DIAGNOSIS — J302 Other seasonal allergic rhinitis: Secondary | ICD-10-CM

## 2021-03-01 DIAGNOSIS — R7989 Other specified abnormal findings of blood chemistry: Secondary | ICD-10-CM | POA: Diagnosis not present

## 2021-03-01 DIAGNOSIS — F330 Major depressive disorder, recurrent, mild: Secondary | ICD-10-CM

## 2021-03-01 DIAGNOSIS — D696 Thrombocytopenia, unspecified: Secondary | ICD-10-CM

## 2021-03-01 DIAGNOSIS — F1020 Alcohol dependence, uncomplicated: Secondary | ICD-10-CM

## 2021-03-01 DIAGNOSIS — G4709 Other insomnia: Secondary | ICD-10-CM

## 2021-03-01 DIAGNOSIS — J3089 Other allergic rhinitis: Secondary | ICD-10-CM

## 2021-03-01 MED ORDER — OLMESARTAN MEDOXOMIL-HCTZ 40-25 MG PO TABS: 1.0000 | ORAL_TABLET | Freq: Every day | ORAL | 1 refills | Status: DC

## 2021-03-01 MED ORDER — CETIRIZINE HCL 10 MG PO TABS: 10.0000 mg | ORAL_TABLET | Freq: Every day | ORAL | 3 refills | Status: DC

## 2021-03-01 NOTE — Progress Notes (Signed)
Name: Danny Chase   MRN: [id number]    DOB: Sep 07, 1975   Date:03/01/2021       Progress Note  Chief Complaint  Patient presents with   Hypertension   Allergic Rhinitis    Medication Refill     Subjective:   Danny Chase is a 45 y.o. male, presents to clinic for routine f/up Pt of Dr. Ancil Boozer Last OV, labs, med list, VS, family hx and HM reviewed today   Last OV a year ago  HTN on benicar 40-25 - meds changed last year at appt, pt did not f/up as advised and refills were put through x 2 will note that he needed f/up  Pt reports good med compliance and denies any SE.   Blood pressure today is well controlled. BP Readings from Last 3 Encounters:  03/01/21 122/76  04/28/20 136/82  03/10/20 (!) 140/110   Pt denies CP, SOB, exertional sx, LE edema, palpitation, Ha's, visual disturbances, lightheadedness, hypotension, syncope.   Hx of MDD/insomnia, on pristiq and trazodone in the past - not using trazodone - doesn't want refills and pristiq is managed by pschiatrist Depression screen [facility] 2/9 03/01/2021 03/10/2020 07/14/2019  Decreased Interest 0 0 0  Down, Depressed, Hopeless 0 0 0  PHQ - 2 Score 0 0 0  Altered sleeping 0 0 1  Tired, decreased energy 0 0 0  Change in appetite 0 0 0  Feeling bad or failure about yourself  0 0 0  Trouble concentrating 0 0 0  Moving slowly or fidgety/restless 0 0 0  Suicidal thoughts 0 0 0  PHQ-9 Score 0 0 1  Difficult doing work/chores Not difficult at all - -  Some recent data might be hidden   GAD 7 : Generalized Anxiety Score 06/23/2018 12/13/2017  Nervous, Anxious, on Edge 0 0  Control/stop worrying 0 0  Worry too much - different things 0 0  Trouble relaxing 0 0  Restless 0 0  Easily annoyed or irritable 0 0  Afraid - awful might happen 0 0  Total GAD 7 Score 0 0  Anxiety Difficulty Not difficult at all Not difficult at all    Hypertriglyceridemia- Lab Results  Component Value Date   CHOL 199 12/09/2020   HDL 40 12/09/2020    LDLCALC 11 12/09/2020   TRIG 280 (A) 12/09/2020   CHOLHDL 4.4 06/23/2018  Rx for vascepa - pt has never taken Still eating poorly - fried food and drinking ETOH daily  Vit D deficiency -  Multiple Vit D rx dose supplements sent in in the past, not currently on meds, last labs good Last vitamin D Lab Results  Component Value Date   VD25OH 42.6 12/09/2020  Last labs normal  Elevated LFTs and hx of Hep A infection Lab Results  Component Value Date   ALT 18 12/09/2020   AST 16 12/09/2020   ALKPHOS 128 (A) 12/09/2020   BILITOT 0.4 09/08/2019  Ast/alt when checked last improved but alk phos mildly elevated and new  TSH recently checked and abnormally elevated, labs last done TSH a few years ago in ~2 Lab Results  Component Value Date   TSH 4.94 12/09/2020   Looks like labs ordered by different provider?  (Nurse with work screening labs) - reviewed, provider noted he needed to f/up on abnormal labs (alk phas, hyponatremia, high tsh) - he has not f/up with PCP and he is aware of all lab results - reviewed with him today - not worried  about thyroid - no family hx of thyroid disease, denies weight/mood changes fatigue  Allergies managed by flonase and zyrtec   HLD - ldl was high - error with abstraction - LDL was 111 Lab Results  Component Value Date   CHOL 199 12/09/2020   HDL 40 12/09/2020   LDLCALC 11 12/09/2020   LDLCALC 111 12/09/2020   TRIG 280 (A) 12/09/2020   CHOLHDL 4.4 06/23/2018    The 10-year ASCVD risk score (Arnett DK, et al., 2019) is: 3.5%*   Values used to calculate the score:     Age: 74 years     Sex: Male     Is Non-Hispanic African American: No     Diabetic: No     Tobacco smoker: No     Systolic Blood Pressure: 073 mmHg     Is BP treated: Yes     HDL Cholesterol: 40 mg/dL*     Total Cholesterol: 199 mg/dL*     * - Cholesterol units were assumed for this score calculation    Current Outpatient Medications:    desvenlafaxine (PRISTIQ) 50 MG 24  hr tablet, Take 1 tablet (50 mg total) by mouth daily., Disp: 30 tablet, Rfl: 0   fluticasone (FLONASE) 50 MCG/ACT nasal spray, Place 2 sprays into both nostrils daily., Disp: , Rfl: 4   cetirizine (ZYRTEC) 10 MG tablet, Take 1 tablet (10 mg total) by mouth at bedtime., Disp: 90 tablet, Rfl: 3   olmesartan-hydrochlorothiazide (BENICAR HCT) 40-25 MG tablet, Take 1 tablet by mouth daily., Disp: 90 tablet, Rfl: 1  Patient Active Problem List   Diagnosis Date Noted   Thrombocytopenia ([place]) 06/23/2018   Perennial allergic rhinitis with seasonal variation 12/13/2017   Vitamin D deficiency 12/13/2017   Alcohol use disorder, moderate, dependence ([place]) 12/13/2017   Psoriasis 12/13/2017   Mild recurrent major depression ([place]) 07/13/2015   Insomnia 07/13/2015   HTN (hypertension) 07/13/2015    History reviewed. No pertinent surgical history.  Family History  Problem Relation Age of Onset   Cancer Mother        Lymphoma   Stroke Mother    Cancer Father        Lymphoma, Colon CA   Hypertension Brother     Social History   Tobacco Use   Smoking status: Never   Smokeless tobacco: Never  Vaping Use   Vaping Use: Never used  Substance Use Topics   Alcohol use: Yes    Alcohol/week: 7.0 standard drinks    Types: 7 Glasses of wine per week   Drug use: No     No Known Allergies  Health Maintenance  Topic Date Due   Pneumococcal Vaccine 75-73 Years old (1 - PCV) Never done   COLONOSCOPY (Pts 45-77yr Insurance coverage will need to be confirmed)  Never done   TETANUS/TDAP  12/14/2026   INFLUENZA VACCINE  Completed   Hepatitis C Screening  Completed   HIV Screening  Completed   HPV VACCINES  Aged Out    Chart Review Today: I personally reviewed active problem list, medication list, allergies, family history, social history, health maintenance, notes from last encounter, lab results, imaging with the patient/caregiver today.   Review of Systems  Constitutional: Negative.   HENT:  Negative.    Eyes: Negative.   Respiratory: Negative.    Cardiovascular: Negative.   Gastrointestinal: Negative.   Endocrine: Negative.   Genitourinary: Negative.   Musculoskeletal: Negative.   Skin: Negative.   Allergic/Immunologic: Negative.   Neurological: Negative.  Hematological: Negative.   Psychiatric/Behavioral: Negative.    All other systems reviewed and are negative.   Objective:   Vitals:   03/01/21 1417  BP: 122/76  Pulse: 96  Resp: 16  Temp: 98.5 F (36.9 C)  SpO2: 98%  Weight: 201 lb 12.8 oz (91.5 kg)  Height: 6' 1"  (1.854 m)    Body mass index is 26.62 kg/m.  Physical Exam Vitals and nursing note reviewed.  Constitutional:      General: He is not in acute distress.    Appearance: Normal appearance. He is well-developed and normal weight. He is not ill-appearing, toxic-appearing or diaphoretic.     Interventions: Face mask in place.  HENT:     Head: Normocephalic and atraumatic.     Jaw: No trismus.     Right Ear: Tympanic membrane, ear canal and external ear normal. There is no impacted cerumen.     Left Ear: Tympanic membrane, ear canal and external ear normal. There is no impacted cerumen.     Nose: Mucosal edema and rhinorrhea present. No congestion. Rhinorrhea is clear.     Right Sinus: No maxillary sinus tenderness or frontal sinus tenderness.     Left Sinus: No maxillary sinus tenderness or frontal sinus tenderness.     Mouth/Throat:     Mouth: Mucous membranes are moist.     Pharynx: Oropharynx is clear. No oropharyngeal exudate or posterior oropharyngeal erythema.  Eyes:     General: Lids are normal. No scleral icterus.       Right eye: No discharge.        Left eye: No discharge.     Conjunctiva/sclera: Conjunctivae normal.  Neck:     Trachea: Trachea and phonation normal. No tracheal deviation.  Cardiovascular:     Rate and Rhythm: Normal rate and regular rhythm.     Pulses: Normal pulses.          Radial pulses are 2+ on the right  side and 2+ on the left side.       Posterior tibial pulses are 2+ on the right side and 2+ on the left side.     Heart sounds: Normal heart sounds. No murmur heard.   No friction rub. No gallop.  Pulmonary:     Effort: Pulmonary effort is normal. No respiratory distress.     Breath sounds: Normal breath sounds. No stridor. No wheezing, rhonchi or rales.  Abdominal:     General: Bowel sounds are normal. There is no distension.     Palpations: Abdomen is soft.  Musculoskeletal:     Right lower leg: No edema.     Left lower leg: No edema.  Skin:    General: Skin is warm and dry.     Coloration: Skin is not jaundiced.     Findings: No rash.     Nails: There is no clubbing.  Neurological:     Mental Status: He is alert. Mental status is at baseline.     Cranial Nerves: No dysarthria or facial asymmetry.     Motor: No tremor or abnormal muscle tone.     Gait: Gait normal.  Psychiatric:        Mood and Affect: Mood normal.        Speech: Speech normal.        Behavior: Behavior normal. Behavior is cooperative.        Assessment & Plan:     ICD-10-CM   1. Essential hypertension  I10 olmesartan-hydrochlorothiazide (BENICAR HCT)  40-25 MG tablet    COMPLETE METABOLIC PANEL WITH GFR   stable well controlled, doing well with med change, BB previously d/c - no palpitations of tachycardia, BP at goal today, due for labs, refills sent    2. Hypertriglyceridemia  A19.3 COMPLETE METABOLIC PANEL WITH GFR    Lipid panel   mildly elevated with last labs, advised avoiding or decreasing ETOH use    3. Vitamin D insufficiency  X90.2 COMPLETE METABOLIC PANEL WITH GFR    VITAMIN D 25 Hydroxy (Vit-D Deficiency, Fractures)   last labs level optimal, off supplements, alk phos abnormal, recheck    4. Need for influenza vaccination  Z23 Flu Vaccine QUAD 6+ mos PF IM (Fluarix Quad PF)    5. Abnormal TSH  I09.73 COMPLETE METABOLIC PANEL WITH GFR    TSH    T4, free   high TSH, recheck TSH and free  T4, pt not concerned about mood/weight/energy changes, explained he may need to come back to discuss or tx if still abnormal    6. Hyponatremia  Z32.9 COMPLETE METABOLIC PANEL WITH GFR   secondary to ETOH?  recheck    7. Elevated alkaline phosphatase level  J24.2 COMPLETE METABOLIC PANEL WITH GFR    VITAMIN D 25 Hydroxy (Vit-D Deficiency, Fractures)    Parathyroid hormone, intact (no Ca)    8. Mild recurrent major depression ([place])  F33.0 TSH   managed by specialists, reviewed PHQ today, negative, continue meds with managing specialists    9. Thrombocytopenia ([place])  D69.6 CBC with Differential/Platelet   recheck    10. Other insomnia  G47.09    not on meds currently - list updated    11. Alcohol use disorder, moderate, dependence ([facility])  F10.20 CBC with Differential/Platelet    COMPLETE METABOLIC PANEL WITH GFR    Lipid panel   continued use - pt did not wish to discuss - I did review how this may affect/be causing several of his abnormal labs    12. Perennial allergic rhinitis with seasonal variation  J30.89 cetirizine (ZYRTEC) 10 MG tablet   J30.2    OTC meds PRN       Return in about 6 months (around 08/29/2021) for Routine follow-up HTN and CPE with PCP .   Delsa Grana, PA-C 03/01/21 2:37 PM

## 2021-03-02 NOTE — Addendum Note (Signed)
Addended by: Danelle Berry on: 03/02/2021 11:30 AM   Modules accepted: Orders

## 2021-03-03 LAB — CBC WITH DIFFERENTIAL/PLATELET
Absolute Monocytes: 501 cells/uL (ref 200–950)
Basophils Absolute: 20 cells/uL (ref 0–200)
Basophils Relative: 0.3 %
Eosinophils Absolute: 91 cells/uL (ref 15–500)
Eosinophils Relative: 1.4 %
HCT: 41.6 % (ref 38.5–50.0)
Hemoglobin: 14.4 g/dL (ref 13.2–17.1)
Lymphs Abs: 2028 cells/uL (ref 850–3900)
MCH: 30.9 pg (ref 27.0–33.0)
MCHC: 34.6 g/dL (ref 32.0–36.0)
MCV: 89.3 fL (ref 80.0–100.0)
MPV: 10.2 fL (ref 7.5–12.5)
Monocytes Relative: 7.7 %
Neutro Abs: 3861 cells/uL (ref 1500–7800)
Neutrophils Relative %: 59.4 %
Platelets: 200 10*3/uL (ref 140–400)
RBC: 4.66 10*6/uL (ref 4.20–5.80)
RDW: 13 % (ref 11.0–15.0)
Total Lymphocyte: 31.2 %
WBC: 6.5 10*3/uL (ref 3.8–10.8)

## 2021-03-03 LAB — COMPLETE METABOLIC PANEL WITH GFR
AG Ratio: 1.5 (calc) (ref 1.0–2.5)
ALT: 17 U/L (ref 9–46)
AST: 19 U/L (ref 10–40)
Albumin: 4.2 g/dL (ref 3.6–5.1)
Alkaline phosphatase (APISO): 101 U/L (ref 36–130)
BUN: 11 mg/dL (ref 7–25)
CO2: 28 mmol/L (ref 20–32)
Calcium: 9.2 mg/dL (ref 8.6–10.3)
Chloride: 102 mmol/L (ref 98–110)
Creat: 0.91 mg/dL (ref 0.60–1.29)
Globulin: 2.8 g/dL (calc) (ref 1.9–3.7)
Glucose, Bld: 112 mg/dL — ABNORMAL HIGH (ref 65–99)
Potassium: 4.3 mmol/L (ref 3.5–5.3)
Sodium: 137 mmol/L (ref 135–146)
Total Bilirubin: 0.5 mg/dL (ref 0.2–1.2)
Total Protein: 7 g/dL (ref 6.1–8.1)
eGFR: 105 mL/min/{1.73_m2} (ref >=60)

## 2021-03-03 LAB — LIPID PANEL
Cholesterol: 229 mg/dL — ABNORMAL HIGH (ref <200)
HDL: 54 mg/dL (ref >=40)
Non-HDL Cholesterol (Calc): 175 mg/dL (calc) — ABNORMAL HIGH (ref <130)
Total CHOL/HDL Ratio: 4.2 (calc) (ref <5.0)
Triglycerides: 535 mg/dL — ABNORMAL HIGH (ref <150)

## 2021-03-03 LAB — TEST AUTHORIZATION

## 2021-03-03 LAB — TSH: TSH: 3.11 mIU/L (ref 0.40–4.50)

## 2021-03-03 LAB — VITAMIN D 25 HYDROXY (VIT D DEFICIENCY, FRACTURES): Vit D, 25-Hydroxy: 31 ng/mL (ref 30–100)

## 2021-03-03 LAB — T4, FREE: Free T4: 1 ng/dL (ref 0.8–1.8)

## 2021-03-03 LAB — LDL CHOLESTEROL, DIRECT: Direct LDL: 68 mg/dL (ref <100)

## 2021-03-03 LAB — PARATHYROID HORMONE, INTACT (NO CA): PTH: 44 pg/mL (ref 16–77)

## 2021-05-17 DIAGNOSIS — F3342 Major depressive disorder, recurrent, in full remission: Secondary | ICD-10-CM | POA: Diagnosis not present

## 2021-08-15 ENCOUNTER — Other Ambulatory Visit: Payer: Self-pay | Admitting: Family Medicine

## 2021-08-15 DIAGNOSIS — I10 Essential (primary) hypertension: Secondary | ICD-10-CM

## 2021-08-28 NOTE — Progress Notes (Unsigned)
Name: Danny Chase   MRN: [id number]    DOB: 04/08/1976   Date:08/28/2021       Progress Note  Subjective  Chief Complaint  Follow Up  HPI  Alcohol use: AUDIT positive, took medication to help him quit in the past but stopped on his own, discussed AA meetings but he said no. Heavy drinker for about 21 years. He states he continues to drink red wine one bottle per day.    Mild Major Depression: sees Dr. Evelene Croon in [place] about once a year . He is now on Pristiq  50 mg and Trazodone but only takes it prn. He states he has been stable, he has been playing disc golf.    HTN: the is on Tenoretic , compliant with medication, he has been physically active, bp is elevated today, but he states he is doing much at home, recently bp at home still shows elevated DBP   Hypertriglyceridemia:  from  700 to high 400's He has not been very compliant with Vascepa, he will recheck labs at work in January  Elevated lft's: seen by Dr. Tawni Carnes and had labs done, reminded him to stop drinking , labs normal    Patient Active Problem List   Diagnosis Date Noted   Thrombocytopenia (HCC) 06/23/2018   Perennial allergic rhinitis with seasonal variation 12/13/2017   Vitamin D deficiency 12/13/2017   Alcohol use disorder, moderate, dependence (HCC) 12/13/2017   Psoriasis 12/13/2017   Mild recurrent major depression (HCC) 07/13/2015   Insomnia 07/13/2015   HTN (hypertension) 07/13/2015    No past surgical history on file.  Family History  Problem Relation Age of Onset   Cancer Mother        Lymphoma   Stroke Mother    Cancer Father        Lymphoma, Colon CA   Hypertension Brother     Social History   Tobacco Use   Smoking status: Never   Smokeless tobacco: Never  Substance Use Topics   Alcohol use: Yes    Alcohol/week: 7.0 standard drinks    Types: 7 Glasses of wine per week     Current Outpatient Medications:    olmesartan-hydrochlorothiazide (BENICAR HCT) 40-25 MG tablet, TAKE 1  TABLET BY MOUTH EVERY DAY, Disp: 30 tablet, Rfl: 0   cetirizine (ZYRTEC) 10 MG tablet, Take 1 tablet (10 mg total) by mouth at bedtime., Disp: 90 tablet, Rfl: 3   desvenlafaxine (PRISTIQ) 50 MG 24 hr tablet, Take 1 tablet (50 mg total) by mouth daily., Disp: 30 tablet, Rfl: 0   fluticasone (FLONASE) 50 MCG/ACT nasal spray, Place 2 sprays into both nostrils daily., Disp: , Rfl: 4  No Known Allergies  I personally reviewed active problem list, medication list, allergies, family history, social history, health maintenance with the patient/caregiver today.   ROS  ***  Objective  There were no vitals filed for this visit.  There is no height or weight on file to calculate BMI.  Physical Exam ***  No results found for this or any previous visit (from the past 2160 hour(s)).   PHQ2/9: Depression screen [facility] 2/9 03/01/2021 03/10/2020 07/14/2019 06/25/2019 12/23/2018  Decreased Interest 0 0 0 0 0  Down, Depressed, Hopeless 0 0 0 0 0  PHQ - 2 Score 0 0 0 0 0  Altered sleeping 0 0 1 0 1  Tired, decreased energy 0 0 0 0 0  Change in appetite 0 0 0 0 0  Feeling bad or failure about  yourself  0 0 0 0 0  Trouble concentrating 0 0 0 0 0  Moving slowly or fidgety/restless 0 0 0 0 0  Suicidal thoughts 0 0 0 0 0  PHQ-9 Score 0 0 1 0 1  Difficult doing work/chores Not difficult at all - - - Not difficult at all  Some recent data might be hidden    phq 9 is {gen pos [id number]}   Fall Risk: Fall Risk  03/01/2021 03/10/2020 07/14/2019 06/25/2019 12/23/2018  Falls in the past year? 0 0 0 0 0  Number falls in past yr: 0 0 0 0 0  Injury with Fall? 0 0 0 0 0      Functional Status Survey:      Assessment & Plan  *** There are no diagnoses linked to this encounter.

## 2021-08-29 ENCOUNTER — Encounter: Payer: Self-pay | Admitting: Family Medicine

## 2021-08-29 ENCOUNTER — Other Ambulatory Visit: Payer: Self-pay

## 2021-08-29 ENCOUNTER — Ambulatory Visit: Payer: BC Managed Care – PPO | Admitting: Family Medicine

## 2021-08-29 VITALS — BP 128/84 | HR 88 | Resp 16 | Ht 71.0 in | Wt 207.0 lb

## 2021-08-29 DIAGNOSIS — E559 Vitamin D deficiency, unspecified: Secondary | ICD-10-CM

## 2021-08-29 DIAGNOSIS — E781 Pure hyperglyceridemia: Secondary | ICD-10-CM

## 2021-08-29 DIAGNOSIS — F330 Major depressive disorder, recurrent, mild: Secondary | ICD-10-CM

## 2021-08-29 DIAGNOSIS — F1020 Alcohol dependence, uncomplicated: Secondary | ICD-10-CM

## 2021-08-29 DIAGNOSIS — G4709 Other insomnia: Secondary | ICD-10-CM

## 2021-08-29 DIAGNOSIS — I10 Essential (primary) hypertension: Secondary | ICD-10-CM

## 2021-08-29 DIAGNOSIS — K219 Gastro-esophageal reflux disease without esophagitis: Secondary | ICD-10-CM

## 2021-08-29 DIAGNOSIS — J3089 Other allergic rhinitis: Secondary | ICD-10-CM

## 2021-08-29 DIAGNOSIS — J302 Other seasonal allergic rhinitis: Secondary | ICD-10-CM

## 2021-08-29 MED ORDER — FENOFIBRATE 145 MG PO TABS
145.0000 mg | ORAL_TABLET | Freq: Every day | ORAL | 1 refills | Status: DC
Start: 2021-08-29 — End: 2022-02-27

## 2021-08-29 MED ORDER — OMEPRAZOLE 40 MG PO CPDR: 40.0000 mg | DELAYED_RELEASE_CAPSULE | Freq: Every day | ORAL | 1 refills | Status: DC

## 2021-08-29 MED ORDER — OLMESARTAN MEDOXOMIL-HCTZ 40-25 MG PO TABS: 1.0000 | ORAL_TABLET | Freq: Every day | ORAL | 1 refills | Status: DC

## 2021-09-19 DIAGNOSIS — Z0189 Encounter for other specified special examinations: Secondary | ICD-10-CM | POA: Diagnosis not present

## 2021-09-20 LAB — LIPID PANEL
Cholesterol: 211 — AB (ref 0–200)
HDL: 55 (ref 35–70)
LDL Cholesterol: 111
LDl/HDL Ratio: 3.8
Triglycerides: 260 — AB (ref 40–160)

## 2021-09-20 LAB — CBC AND DIFFERENTIAL
HCT: 41 (ref 41–53)
Hemoglobin: 14.4 (ref 13.5–17.5)
Neutrophils Absolute: 58
Platelets: 170 10*3/uL (ref 150–400)
WBC: 6

## 2021-09-20 LAB — BASIC METABOLIC PANEL
Chloride: 104 (ref 99–108)
Potassium: 3.8 mEq/L (ref 3.5–5.1)
Sodium: 141 (ref 137–147)

## 2021-09-20 LAB — COMPREHENSIVE METABOLIC PANEL
Albumin: 4.2 (ref 3.5–5.0)
Globulin: 2.7

## 2021-09-20 LAB — HEMOGLOBIN A1C: Hemoglobin A1C: 5.2

## 2021-09-20 LAB — HEPATIC FUNCTION PANEL
ALT: 24 U/L (ref 10–40)
AST: 31 (ref 14–40)
Alkaline Phosphatase: 103 (ref 25–125)
Bilirubin, Total: 0.3

## 2021-09-20 LAB — VITAMIN D 25 HYDROXY (VIT D DEFICIENCY, FRACTURES): Vit D, 25-Hydroxy: 23.3

## 2021-09-20 LAB — CBC: RBC: 4.51 (ref 3.87–5.11)

## 2021-09-20 LAB — TSH: TSH: 6.33 — AB (ref 0.41–5.90)

## 2021-09-21 NOTE — Progress Notes (Signed)
Abstracted per [facility] report ?

## 2022-02-26 NOTE — Progress Notes (Unsigned)
Name: Danny Chase   MRN: [id number]    DOB: 1975-07-04   Date:02/27/2022       Progress Note  Subjective  Chief Complaint  Follow Up  HPI  URI: he developed some rhinorrhea, nasal congestion , post-nasal drainage, mild sore throat, and mild cough , no wheezing or SOB, no fever or chills. No fatigue or change in appetite. No headaches. He had COVID-19 vaccines, he denies any sick contacts. He works on his own space at work and does not want to be tested for COVID-19 today. Taking otc medication, mucinex   Alcohol use: AUDIT  was positive in the past Heavy drinker for about 21 years. He is no longer drinking every night, he is dating the mother of his son again since the Summer . He does not drink alone as often    Mild Major Depression: sees Dr. Evelene Croon in [place] about once a year . He is now on Pristiq  50 mg and Trazodone but only takes it prn. He states he has been stable, he has been playing disc golf 4-5 times per week.  He is dating the mother of his son since the Summer   Cervical radiculitis: pain on left upper back that radiates to left hand down to 4th and 5th fingers.  Symptoms started a few weeks ago, he can reproduce pain sometimes by moving his neck to right and flexing it. He felt possible weakness when lifting something heavy this week    HTN: he is taking Benicar daily and bp is at goal. No chest pain or palpitation Continue current regiment    Hypertriglyceridemia:  from  700 to high 400's , last time it was down to 211 on [employer] and tolerating medication well   Elevated lft's: seen by Dr. Tawni Carnes in the past , last hepatic function was normal. Unchanged   Elevated TSH: he wants to hold off on repeating level until next year when he gets it free at work in April. He denies weight gain, or fatigue   Patient Active Problem List   Diagnosis Date Noted   Thrombocytopenia (HCC) 06/23/2018   Perennial allergic rhinitis with seasonal variation 12/13/2017   Vitamin D  deficiency 12/13/2017   Alcohol use disorder, moderate, dependence (HCC) 12/13/2017   Psoriasis 12/13/2017   Mild recurrent major depression (HCC) 07/13/2015   Insomnia 07/13/2015   HTN (hypertension) 07/13/2015    No past surgical history on file.  Family History  Problem Relation Age of Onset   Cancer Mother        Lymphoma   Stroke Mother    Cancer Father        Lymphoma, Colon CA   Hypertension Brother     Social History   Tobacco Use   Smoking status: Never   Smokeless tobacco: Never  Substance Use Topics   Alcohol use: Yes    Alcohol/week: 7.0 standard drinks of alcohol    Types: 7 Glasses of wine per week     Current Outpatient Medications:    cetirizine (ZYRTEC) 10 MG tablet, Take 1 tablet (10 mg total) by mouth at bedtime., Disp: 90 tablet, Rfl: 3   desvenlafaxine (PRISTIQ) 50 MG 24 hr tablet, Take 50 mg by mouth daily., Disp: , Rfl:    fenofibrate (TRICOR) 145 MG tablet, Take 1 tablet (145 mg total) by mouth daily., Disp: 90 tablet, Rfl: 1   fluticasone (FLONASE) 50 MCG/ACT nasal spray, Place 2 sprays into both nostrils daily., Disp: , Rfl: 4  olmesartan-hydrochlorothiazide (BENICAR HCT) 40-25 MG tablet, Take 1 tablet by mouth daily., Disp: 90 tablet, Rfl: 1   omeprazole (PRILOSEC) 40 MG capsule, Take 1 capsule (40 mg total) by mouth daily., Disp: 90 capsule, Rfl: 1  No Known Allergies  I personally reviewed active problem list, medication list, allergies, family history, social history, health maintenance with the patient/caregiver today.   ROS  Constitutional: Negative for fever or weight change.  Respiratory: Negative for cough and shortness of breath.   Cardiovascular: Negative for chest pain or palpitations.  Gastrointestinal: Negative for abdominal pain, no bowel changes.  Musculoskeletal: Negative for gait problem or joint swelling.  Skin: Negative for rash.  Neurological: Negative for dizziness or headache.  No other specific complaints in a  complete review of systems (except as listed in HPI above).   Objective  Vitals:   02/27/22 1509  BP: 112/76  Pulse: 91  Resp: 16  SpO2: 98%  Weight: 203 lb (92.1 kg)  Height: 5\' 11"  (1.803 m)    Body mass index is 28.31 kg/m.  Physical Exam  Constitutional: Patient appears well-developed and well-nourished.  No distress.  HEENT: head atraumatic, normocephalic, pupils equal and reactive to light, neck supple Cardiovascular: Normal rate, regular rhythm and normal heart sounds.  No murmur heard. No BLE edema. Pulmonary/Chest: Effort normal and breath sounds normal. No respiratory distress. Abdominal: Soft.  There is no tenderness. Muscular skeletal ; normal grip, pain worse when flexing neck while rotating to right side  Psychiatric: Patient has a normal mood and affect. behavior is normal. Judgment and thought content normal.   PHQ2/9:    02/27/2022    3:10 PM 08/29/2021    2:47 PM 03/01/2021    2:18 PM 03/10/2020   10:04 AM 07/14/2019   11:44 AM  Depression screen PHQ 2/9  Decreased Interest 0 0 0 0 0  Down, Depressed, Hopeless 0 0 0 0 0  PHQ - 2 Score 0 0 0 0 0  Altered sleeping 0 0 0 0 1  Tired, decreased energy 0 0 0 0 0  Change in appetite 0 0 0 0 0  Feeling bad or failure about yourself  0 0 0 0 0  Trouble concentrating 0 0 0 0 0  Moving slowly or fidgety/restless 0 0 0 0 0  Suicidal thoughts 0 0 0 0 0  PHQ-9 Score 0 0 0 0 1  Difficult doing work/chores   Not difficult at all      phq 9 is negative   Fall Risk:    02/27/2022    3:10 PM 08/29/2021    2:47 PM 03/01/2021    2:19 PM 03/10/2020   10:04 AM 07/14/2019   11:45 AM  Fall Risk   Falls in the past year? 0 0 0 0 0  Number falls in past yr: 0 0 0 0 0  Injury with Fall? 0 0 0 0 0  Risk for fall due to : No Fall Risks No Fall Risks     Follow up Falls prevention discussed Falls prevention discussed         Functional Status Survey: Is the patient deaf or have difficulty hearing?: No Does the patient  have difficulty seeing, even when wearing glasses/contacts?: No Does the patient have difficulty concentrating, remembering, or making decisions?: No Does the patient have difficulty walking or climbing stairs?: No Does the patient have difficulty dressing or bathing?: No Does the patient have difficulty doing errands alone such as visiting a doctor's  office or shopping?: No    Assessment & Plan  1. Mild recurrent major depression (HCC)  Keep follow up with Dr. Evelene Croon  2. Essential hypertension  - olmesartan-hydrochlorothiazide (BENICAR HCT) 40-25 MG tablet; Take 1 tablet by mouth daily.  Dispense: 90 tablet; Refill: 1  3. Vitamin D insufficiency  Taking supplemets  4. Perennial allergic rhinitis with seasonal variation  Currently seems to have URI but does not want to be checked for COVId   5. Alcohol use disorder, moderate, dependence (HCC)  He states cutting down   6. GERD without esophagitis  - omeprazole (PRILOSEC) 40 MG capsule; Take 1 capsule (40 mg total) by mouth daily.  Dispense: 90 capsule; Refill: 1  7. Elevated TSH  He wants to hold off on rechecking level   8. Cervical radiculopathy at C7  - iclofenac (VOLTAREN) 75 MG EC tablet; Take 1 tablet (75 mg total) by mouth 2 (two) times daily. For 10 days after that prn  Dispense: 30 tablet; Refill: 0 - metaxalone (SKELAXIN) 800 MG tablet; Take 1 tablet (800 mg total) by mouth 3 (three) times daily as needed for muscle spasms.  Dispense: 30 tablet; Refill: 0  10. Essential hypertension  - olmesartan-hydrochlorothiazide (BENICAR HCT) 40-25 MG tablet; Take 1 tablet by mouth daily.  Dispense: 90 tablet; Refill: 1  11. Hypertriglyceridemia  - fenofibrate (TRICOR) 145 MG tablet; Take 1 tablet (145 mg total) by mouth daily.  Dispense: 90 tablet; Refill: 1

## 2022-02-27 ENCOUNTER — Ambulatory Visit: Payer: BC Managed Care – PPO | Admitting: Family Medicine

## 2022-02-27 ENCOUNTER — Encounter: Payer: Self-pay | Admitting: Family Medicine

## 2022-02-27 VITALS — BP 112/76 | HR 91 | Resp 16 | Ht 71.0 in | Wt 203.0 lb

## 2022-02-27 DIAGNOSIS — J3089 Other allergic rhinitis: Secondary | ICD-10-CM

## 2022-02-27 DIAGNOSIS — I10 Essential (primary) hypertension: Secondary | ICD-10-CM | POA: Diagnosis not present

## 2022-02-27 DIAGNOSIS — F330 Major depressive disorder, recurrent, mild: Secondary | ICD-10-CM | POA: Diagnosis not present

## 2022-02-27 DIAGNOSIS — E559 Vitamin D deficiency, unspecified: Secondary | ICD-10-CM

## 2022-02-27 DIAGNOSIS — E781 Pure hyperglyceridemia: Secondary | ICD-10-CM

## 2022-02-27 DIAGNOSIS — R7989 Other specified abnormal findings of blood chemistry: Secondary | ICD-10-CM | POA: Insufficient documentation

## 2022-02-27 DIAGNOSIS — F1020 Alcohol dependence, uncomplicated: Secondary | ICD-10-CM

## 2022-02-27 DIAGNOSIS — K219 Gastro-esophageal reflux disease without esophagitis: Secondary | ICD-10-CM | POA: Insufficient documentation

## 2022-02-27 DIAGNOSIS — J302 Other seasonal allergic rhinitis: Secondary | ICD-10-CM

## 2022-02-27 DIAGNOSIS — M5412 Radiculopathy, cervical region: Secondary | ICD-10-CM

## 2022-02-27 MED ORDER — FENOFIBRATE 145 MG PO TABS: 145.0000 mg | ORAL_TABLET | Freq: Every day | ORAL | 1 refills | Status: DC

## 2022-02-27 MED ORDER — OMEPRAZOLE 40 MG PO CPDR: 40.0000 mg | DELAYED_RELEASE_CAPSULE | Freq: Every day | ORAL | 1 refills | Status: DC

## 2022-02-27 MED ORDER — METAXALONE 800 MG PO TABS: 800.0000 mg | ORAL_TABLET | Freq: Three times a day (TID) | ORAL | 0 refills | Status: DC | PRN

## 2022-02-27 MED ORDER — OLMESARTAN MEDOXOMIL-HCTZ 40-25 MG PO TABS: 1.0000 | ORAL_TABLET | Freq: Every day | ORAL | 1 refills | Status: DC

## 2022-02-27 MED ORDER — DICLOFENAC SODIUM 75 MG PO TBEC: 75.0000 mg | DELAYED_RELEASE_TABLET | Freq: Two times a day (BID) | ORAL | 0 refills | Status: DC

## 2022-03-13 DIAGNOSIS — Z23 Encounter for immunization: Secondary | ICD-10-CM | POA: Diagnosis not present

## 2022-05-04 ENCOUNTER — Other Ambulatory Visit: Payer: Self-pay | Admitting: Family Medicine

## 2022-05-04 DIAGNOSIS — J302 Other seasonal allergic rhinitis: Secondary | ICD-10-CM

## 2022-08-26 ENCOUNTER — Other Ambulatory Visit: Payer: Self-pay | Admitting: Family Medicine

## 2022-08-26 DIAGNOSIS — K219 Gastro-esophageal reflux disease without esophagitis: Secondary | ICD-10-CM

## 2022-08-26 DIAGNOSIS — I10 Essential (primary) hypertension: Secondary | ICD-10-CM

## 2022-08-26 DIAGNOSIS — E781 Pure hyperglyceridemia: Secondary | ICD-10-CM

## 2022-08-27 NOTE — Progress Notes (Unsigned)
Name: Danny Chase   MRN: [id number]    DOB: 28-Dec-1975   Date:08/28/2022       Progress Note  Subjective  Chief Complaint  Follow Up  HPI  Alcohol use: AUDIT  was positive in the past Heavy drinker for about 21 years. He is drinking daily again, usually beer or wine ( a hand-full each time) , he drinks more than 6 drinks a couple of times a week.   Mild Major Depression: sees Dr. Toy Care in [place] about once a year . He is now on Pristiq  50 mg and Trazodone but only takes it prn. He states he has been stable, he has been playing disc golf 4-5 times per week. No longer dating the mother of his son but coping well   Thrombocytopenia: recheck levels in April through work   Cervical radiculitis: pain on left upper back that radiates to left hand down to 4th and 5th fingers.  Symptoms lasted a few weeks and happened about 6 months ago but resovled now    HTN: he is taking Benicar daily and bp is at goal. No chest pain, dizziness or palpitation    Hypertriglyceridemia:  from  700 to high 400's , and last visit it was down to 260 he has been  on Tricor and tolerating medication well . He is cutting down on fast food , cooking more often at home   Elevated lft's: seen by Dr. Enriqueta Shutter in the past , last hepatic function was normal. His last level was normal   Elevated TSH: he wants to hold off on repeating level until next year when he gets it free at work in April 2024   GERD: under control with omeprazole, no heartburn or indigestion, discussed trying to skip doses and take it every other day   Patient Active Problem List   Diagnosis Date Noted   Elevated TSH 02/27/2022   GERD without esophagitis 02/27/2022   Thrombocytopenia ([place]) 06/23/2018   Perennial allergic rhinitis with seasonal variation 12/13/2017   Vitamin D insufficiency 12/13/2017   Alcohol use disorder, moderate, dependence ([place]) 12/13/2017   Psoriasis 12/13/2017   Mild recurrent major depression ([place]) 07/13/2015    Insomnia 07/13/2015   Essential hypertension 07/13/2015    History reviewed. No pertinent surgical history.  Family History  Problem Relation Age of Onset   Cancer Mother        Lymphoma   Stroke Mother    Cancer Father        Lymphoma, Colon CA   Hypertension Brother     Social History   Tobacco Use   Smoking status: Never   Smokeless tobacco: Never  Substance Use Topics   Alcohol use: Yes    Alcohol/week: 7.0 standard drinks of alcohol    Types: 7 Glasses of wine per week     Current Outpatient Medications:    cetirizine (ZYRTEC) 10 MG tablet, TAKE 1 TABLET BY MOUTH EVERYDAY AT BEDTIME, Disp: 90 tablet, Rfl: 3   desvenlafaxine (PRISTIQ) 50 MG 24 hr tablet, Take 50 mg by mouth daily., Disp: , Rfl:    fenofibrate (TRICOR) 145 MG tablet, Take 1 tablet (145 mg total) by mouth daily., Disp: 90 tablet, Rfl: 1   fluticasone (FLONASE) 50 MCG/ACT nasal spray, Place 2 sprays into both nostrils daily., Disp: , Rfl: 4   olmesartan-hydrochlorothiazide (BENICAR HCT) 40-25 MG tablet, Take 1 tablet by mouth daily., Disp: 90 tablet, Rfl: 1   omeprazole (PRILOSEC) 40 MG capsule, Take 1  capsule (40 mg total) by mouth daily., Disp: 90 capsule, Rfl: 1   diclofenac (VOLTAREN) 75 MG EC tablet, Take 1 tablet (75 mg total) by mouth 2 (two) times daily. For 10 days after that prn (Patient not taking: Reported on 08/28/2022), Disp: 30 tablet, Rfl: 0   metaxalone (SKELAXIN) 800 MG tablet, Take 1 tablet (800 mg total) by mouth 3 (three) times daily as needed for muscle spasms. (Patient not taking: Reported on 08/28/2022), Disp: 30 tablet, Rfl: 0  No Known Allergies  I personally reviewed active problem list, medication list, allergies, family history, social history, health maintenance with the patient/caregiver today.   ROS  Ten systems reviewed and is negative except as mentioned in HPI   Objective  Vitals:   08/28/22 1433  BP: 116/72  Pulse: 79  Resp: 14  Temp: 97.8 F (36.6 C)   TempSrc: Oral  SpO2: 98%  Weight: 200 lb 8 oz (90.9 kg)  Height: '5\' 11"'$  (1.803 m)    Body mass index is 27.96 kg/m.  Physical Exam  Constitutional: Patient appears well-developed and well-nourished.  No distress.  HEENT: head atraumatic, normocephalic, pupils equal and reactive to light, neck supple Cardiovascular: Normal rate, regular rhythm and normal heart sounds.  No murmur heard. No BLE edema. Pulmonary/Chest: Effort normal and breath sounds normal. No respiratory distress. Abdominal: Soft.  There is no tenderness. Psychiatric: Patient has a normal mood and affect. behavior is normal. Judgment and thought content normal.    PHQ2/9:    08/28/2022    2:41 PM 02/27/2022    3:10 PM 08/29/2021    2:47 PM 03/01/2021    2:18 PM 03/10/2020   10:04 AM  Depression screen PHQ 2/9  Decreased Interest 0 0 0 0 0  Down, Depressed, Hopeless 0 0 0 0 0  PHQ - 2 Score 0 0 0 0 0  Altered sleeping 0 0 0 0 0  Tired, decreased energy 0 0 0 0 0  Change in appetite 0 0 0 0 0  Feeling bad or failure about yourself  0 0 0 0 0  Trouble concentrating 0 0 0 0 0  Moving slowly or fidgety/restless 0 0 0 0 0  Suicidal thoughts 0 0 0 0 0  PHQ-9 Score 0 0 0 0 0  Difficult doing work/chores    Not difficult at all     phq 9 is negative   Fall Risk:    08/28/2022    2:31 PM 02/27/2022    3:10 PM 08/29/2021    2:47 PM 03/01/2021    2:19 PM 03/10/2020   10:04 AM  Fall Risk   Falls in the past year? 0 0 0 0 0  Number falls in past yr:  0 0 0 0  Injury with Fall?  0 0 0 0  Risk for fall due to : No Fall Risks No Fall Risks No Fall Risks    Follow up Falls prevention discussed Falls prevention discussed Falls prevention discussed        Functional Status Survey: Is the patient deaf or have difficulty hearing?: No Does the patient have difficulty seeing, even when wearing glasses/contacts?: No Does the patient have difficulty concentrating, remembering, or making decisions?: No Does the patient  have difficulty walking or climbing stairs?: No Does the patient have difficulty dressing or bathing?: No Does the patient have difficulty doing errands alone such as visiting a doctor's office or shopping?: No    Assessment & Plan  1. Mild  recurrent major depression ([place])  Doing well, keep follow up with psychiatrist  2. Alcohol use disorder, moderate, dependence ([place])  He is not ready to stop taking it   3. Thrombocytopenia ([place])  Recheck in April   4. Perennial allergic rhinitis with seasonal variation  Doing well   5. GERD without esophagitis  - omeprazole (PRILOSEC) 40 MG capsule; Take 1 capsule (40 mg total) by mouth daily.  Dispense: 90 capsule; Refill: 1  6. Essential hypertension  - olmesartan-hydrochlorothiazide (BENICAR HCT) 40-25 MG tablet; Take 1 tablet by mouth daily.  Dispense: 90 tablet; Refill: 1  7. Vitamin D insufficiency  Continue supplementation   8. Colon cancer screening  - Fecal Globin By Immunochemistry  9. Cervical radiculopathy at C7  Resolved  10. Hypertriglyceridemia  - fenofibrate (TRICOR) 145 MG tablet; Take 1 tablet (145 mg total) by mouth daily.  Dispense: 90 tablet; Refill: 1

## 2022-08-28 ENCOUNTER — Encounter: Payer: Self-pay | Admitting: Family Medicine

## 2022-08-28 ENCOUNTER — Ambulatory Visit: Payer: BC Managed Care – PPO | Admitting: Family Medicine

## 2022-08-28 VITALS — BP 116/72 | HR 79 | Temp 97.8°F | Resp 14 | Ht 71.0 in | Wt 200.5 lb

## 2022-08-28 DIAGNOSIS — D696 Thrombocytopenia, unspecified: Secondary | ICD-10-CM

## 2022-08-28 DIAGNOSIS — J3089 Other allergic rhinitis: Secondary | ICD-10-CM

## 2022-08-28 DIAGNOSIS — Z1211 Encounter for screening for malignant neoplasm of colon: Secondary | ICD-10-CM

## 2022-08-28 DIAGNOSIS — I10 Essential (primary) hypertension: Secondary | ICD-10-CM

## 2022-08-28 DIAGNOSIS — E781 Pure hyperglyceridemia: Secondary | ICD-10-CM

## 2022-08-28 DIAGNOSIS — F330 Major depressive disorder, recurrent, mild: Secondary | ICD-10-CM | POA: Diagnosis not present

## 2022-08-28 DIAGNOSIS — K219 Gastro-esophageal reflux disease without esophagitis: Secondary | ICD-10-CM

## 2022-08-28 DIAGNOSIS — F1020 Alcohol dependence, uncomplicated: Secondary | ICD-10-CM

## 2022-08-28 DIAGNOSIS — J302 Other seasonal allergic rhinitis: Secondary | ICD-10-CM

## 2022-08-28 DIAGNOSIS — E559 Vitamin D deficiency, unspecified: Secondary | ICD-10-CM

## 2022-08-28 DIAGNOSIS — M5412 Radiculopathy, cervical region: Secondary | ICD-10-CM

## 2022-08-28 MED ORDER — FENOFIBRATE 145 MG PO TABS: 145.0000 mg | ORAL_TABLET | Freq: Every day | ORAL | 1 refills | Status: DC

## 2022-08-28 MED ORDER — OLMESARTAN MEDOXOMIL-HCTZ 40-25 MG PO TABS: 1.0000 | ORAL_TABLET | Freq: Every day | ORAL | 1 refills | Status: DC

## 2022-08-28 MED ORDER — OMEPRAZOLE 40 MG PO CPDR: 40.0000 mg | DELAYED_RELEASE_CAPSULE | Freq: Every day | ORAL | 1 refills | Status: DC

## 2022-08-28 NOTE — Patient Instructions (Signed)
Screen colonoscopy - if normal every 10 years Cologuard - every 3 years  FIT test - yearly

## 2022-09-11 DIAGNOSIS — F3341 Major depressive disorder, recurrent, in partial remission: Secondary | ICD-10-CM | POA: Diagnosis not present

## 2022-10-04 LAB — COMPREHENSIVE METABOLIC PANEL
Albumin: 4.2 (ref 3.5–5.0)
Calcium: 9.2 (ref 8.7–10.7)
Globulin: 2.8
eGFR: 91

## 2022-10-04 LAB — BASIC METABOLIC PANEL
BUN: 12 (ref 4–21)
Chloride: 103 (ref 99–108)
Creatinine: 1 (ref 0.6–1.3)
Glucose: 118
Potassium: 3.8 mEq/L (ref 3.5–5.1)
Sodium: 140 (ref 137–147)

## 2022-10-04 LAB — HEPATIC FUNCTION PANEL
ALT: 20 U/L (ref 10–40)
AST: 20 (ref 14–40)
Alkaline Phosphatase: 78 (ref 25–125)
Bilirubin, Total: 0.2

## 2022-10-04 LAB — LIPID PANEL
Cholesterol: 182 (ref 0–200)
HDL: 52 (ref 35–70)
LDL Cholesterol: 67
Triglycerides: 404 — AB (ref 40–160)

## 2022-10-04 LAB — CBC AND DIFFERENTIAL
HCT: 45 (ref 41–53)
Hemoglobin: 15.2 (ref 13.5–17.5)
Neutrophils Absolute: 2
Platelets: 223 10*3/uL (ref 150–400)
WBC: 5.3

## 2022-10-04 LAB — IRON,TIBC AND FERRITIN PANEL: Iron: 70

## 2022-10-04 LAB — VITAMIN D 25 HYDROXY (VIT D DEFICIENCY, FRACTURES): Vit D, 25-Hydroxy: 30.9

## 2022-10-04 LAB — CBC: RBC: 5.11 (ref 3.87–5.11)

## 2022-10-04 LAB — TSH: TSH: 3.1 (ref 0.41–5.90)

## 2022-10-04 LAB — HEMOGLOBIN A1C: Hemoglobin A1C: 5.2

## 2023-02-20 DIAGNOSIS — S0181XA Laceration without foreign body of other part of head, initial encounter: Secondary | ICD-10-CM | POA: Diagnosis not present

## 2023-02-26 DIAGNOSIS — Z4802 Encounter for removal of sutures: Secondary | ICD-10-CM | POA: Diagnosis not present

## 2023-03-01 NOTE — Progress Notes (Unsigned)
Name: Danny Chase   MRN: [id number]    DOB: Nov 09, 1975   Date:03/04/2023       Progress Note  Subjective  Chief Complaint  Follow up  HPI  Alcohol use: AUDIT  was positive in the past Heavy drinker for about 21 years. He is drinking daily again, usually wine now, very seldom more than 6 drinks per day. He is not ready to quit   Recent facial laceration: fell from skateboard, he was riding with his son. Went to Urgent Care and had stiches placed and took a Tdap  Mild Major Depression: sees Dr. Evelene Croon in [place] about once a year . He is now off Pritiq and Trazodone ( but still has some of the medication at home)  he has Auvelity and seems to be working for him    HTN: he is taking Benicar daily and bp is at goal. No chest pain, dizziness or palpitation . He states he snores and notices pauses during sleep.   Hypertriglyceridemia:  last level was up again in the 400's he is not sure if he was fasting for 12 hours. He is taking fenofibrate daily   Elevated lft's: seen by Dr. Tawni Carnes in the past , last hepatic function was normal.  However levels have normalized   Elevated TSH: last level was back to normal range   GERD: under control with omeprazole, no heartburn or indigestion, he skips medication occasionally but only notices it when he skips more than a few days in a row    Patient Active Problem List   Diagnosis Date Noted   Elevated TSH 02/27/2022   GERD without esophagitis 02/27/2022   Thrombocytopenia ([facility]) 06/23/2018   Perennial allergic rhinitis with seasonal variation 12/13/2017   Vitamin D insufficiency 12/13/2017   Alcohol use disorder, moderate, dependence ([facility]) 12/13/2017   Psoriasis 12/13/2017   Mild recurrent major depression ([facility]) 07/13/2015   Insomnia 07/13/2015   Essential hypertension 07/13/2015    History reviewed. No pertinent surgical history.  Family History  Problem Relation Age of Onset   Cancer Mother        Lymphoma   Stroke Mother     Cancer Father        Lymphoma, Colon CA   Hypertension Brother     Social History   Tobacco Use   Smoking status: Never   Smokeless tobacco: Never  Substance Use Topics   Alcohol use: Yes    Alcohol/week: 7.0 standard drinks of alcohol    Types: 7 Glasses of wine per week     Current Outpatient Medications:    AUVELITY 45-105 MG TBCR, Take 1 tablet by mouth daily at 12 noon., Disp: , Rfl:    Azelastine HCl 137 MCG/SPRAY SOLN, USE 2 SPRAYS INTO EACH NOSTRIL ONCE DAILY, Disp: , Rfl:    cetirizine (ZYRTEC) 10 MG tablet, TAKE 1 TABLET BY MOUTH EVERYDAY AT BEDTIME, Disp: 90 tablet, Rfl: 3   fenofibrate (TRICOR) 145 MG tablet, Take 1 tablet (145 mg total) by mouth daily., Disp: 90 tablet, Rfl: 1   fluticasone (FLONASE) 50 MCG/ACT nasal spray, Place 2 sprays into both nostrils daily., Disp: , Rfl: 4   olmesartan-hydrochlorothiazide (BENICAR HCT) 40-25 MG tablet, Take 1 tablet by mouth daily., Disp: 90 tablet, Rfl: 1   omeprazole (PRILOSEC) 40 MG capsule, Take 1 capsule (40 mg total) by mouth daily., Disp: 90 capsule, Rfl: 1  No Known Allergies  I personally reviewed active problem list, medication list, allergies, family history with the  patient/caregiver today.   ROS  Ten systems reviewed and is negative except as mentioned in HPI    Objective  Vitals:   03/04/23 1534  BP: 118/74  Pulse: 79  Resp: 14  Temp: 98.3 F (36.8 C)  TempSrc: Oral  SpO2: 97%  Weight: 206 lb 14.4 oz (93.8 kg)  Height: 5\' 11"  (1.803 m)    Body mass index is 28.86 kg/m.  Physical Exam  Constitutional: Patient appears well-developed and well-nourished.  No distress.  HEENT: head atraumatic, normocephalic, pupils equal and reactive to light, neck supple Cardiovascular: Normal rate, regular rhythm and normal heart sounds.  No murmur heard. No BLE edema. Pulmonary/Chest: Effort normal and breath sounds normal. No respiratory distress. Abdominal: Soft.  There is no tenderness. Psychiatric:  Patient has a normal mood and affect. behavior is normal. Judgment and thought content normal.    PHQ2/9:    03/04/2023    3:38 PM 08/28/2022    2:41 PM 02/27/2022    3:10 PM 08/29/2021    2:47 PM 03/01/2021    2:18 PM  Depression screen PHQ 2/9  Decreased Interest 0 0 0 0 0  Down, Depressed, Hopeless 0 0 0 0 0  PHQ - 2 Score 0 0 0 0 0  Altered sleeping 0 0 0 0 0  Tired, decreased energy 0 0 0 0 0  Change in appetite 0 0 0 0 0  Feeling bad or failure about yourself  0 0 0 0 0  Trouble concentrating 0 0 0 0 0  Moving slowly or fidgety/restless 0 0 0 0 0  Suicidal thoughts 0 0 0 0 0  PHQ-9 Score 0 0 0 0 0  Difficult doing work/chores     Not difficult at all    phq 9 is negative   Fall Risk:    03/04/2023    3:38 PM 08/28/2022    2:31 PM 02/27/2022    3:10 PM 08/29/2021    2:47 PM 03/01/2021    2:19 PM  Fall Risk   Falls in the past year? 0 0 0 0 0  Number falls in past yr:   0 0 0  Injury with Fall?   0 0 0  Risk for fall due to : No Fall Risks No Fall Risks No Fall Risks No Fall Risks   Follow up Falls prevention discussed Falls prevention discussed Falls prevention discussed Falls prevention discussed      Functional Status Survey: Is the patient deaf or have difficulty hearing?: No Does the patient have difficulty seeing, even when wearing glasses/contacts?: No Does the patient have difficulty concentrating, remembering, or making decisions?: No Does the patient have difficulty walking or climbing stairs?: No Does the patient have difficulty dressing or bathing?: No Does the patient have difficulty doing errands alone such as visiting a doctor's office or shopping?: No    Assessment & Plan  1. Essential hypertension  - olmesartan-hydrochlorothiazide (BENICAR HCT) 40-25 MG tablet; Take 1 tablet by mouth daily.  Dispense: 90 tablet; Refill: 1  2. Mild recurrent major depression ([facility])  On new medication under the care of Dr. Evelene Croon  3. Alcohol use disorder,  moderate, dependence ([facility])  stable  4. Hypertriglyceridemia  - fenofibrate (TRICOR) 145 MG tablet; Take 1 tablet (145 mg total) by mouth daily.  Dispense: 90 tablet; Refill: 1  5. Perennial allergic rhinitis with seasonal variation  - cetirizine (ZYRTEC) 10 MG tablet; Take 1 tablet (10 mg total) by mouth daily.  Dispense: 90  tablet; Refill: 3  6. Need for immunization against influenza  - Flu vaccine trivalent PF, 6mos and older(Flulaval,Afluria,Fluarix,Fluzone)  7. GERD without esophagitis  - omeprazole (PRILOSEC) 40 MG capsule; Take 1 capsule (40 mg total) by mouth daily.  Dispense: 90 capsule; Refill: 1  8. Colon cancer screening  - Cologuard  9. Vitamin D insufficiency  Continue supplementation  10. Elevated TSH  Last level normalized

## 2023-03-02 ENCOUNTER — Other Ambulatory Visit: Payer: Self-pay | Admitting: Family Medicine

## 2023-03-02 DIAGNOSIS — K219 Gastro-esophageal reflux disease without esophagitis: Secondary | ICD-10-CM

## 2023-03-02 DIAGNOSIS — I10 Essential (primary) hypertension: Secondary | ICD-10-CM

## 2023-03-02 DIAGNOSIS — E781 Pure hyperglyceridemia: Secondary | ICD-10-CM

## 2023-03-04 ENCOUNTER — Ambulatory Visit: Payer: BC Managed Care – PPO | Admitting: Family Medicine

## 2023-03-04 ENCOUNTER — Encounter: Payer: Self-pay | Admitting: Family Medicine

## 2023-03-04 VITALS — BP 118/74 | HR 79 | Temp 98.3°F | Resp 14 | Ht 71.0 in | Wt 206.9 lb

## 2023-03-04 DIAGNOSIS — F330 Major depressive disorder, recurrent, mild: Secondary | ICD-10-CM

## 2023-03-04 DIAGNOSIS — E781 Pure hyperglyceridemia: Secondary | ICD-10-CM

## 2023-03-04 DIAGNOSIS — F1020 Alcohol dependence, uncomplicated: Secondary | ICD-10-CM | POA: Diagnosis not present

## 2023-03-04 DIAGNOSIS — Z1211 Encounter for screening for malignant neoplasm of colon: Secondary | ICD-10-CM

## 2023-03-04 DIAGNOSIS — E559 Vitamin D deficiency, unspecified: Secondary | ICD-10-CM

## 2023-03-04 DIAGNOSIS — I10 Essential (primary) hypertension: Secondary | ICD-10-CM

## 2023-03-04 DIAGNOSIS — J3089 Other allergic rhinitis: Secondary | ICD-10-CM

## 2023-03-04 DIAGNOSIS — Z23 Encounter for immunization: Secondary | ICD-10-CM | POA: Diagnosis not present

## 2023-03-04 DIAGNOSIS — K219 Gastro-esophageal reflux disease without esophagitis: Secondary | ICD-10-CM

## 2023-03-04 DIAGNOSIS — J302 Other seasonal allergic rhinitis: Secondary | ICD-10-CM

## 2023-03-04 DIAGNOSIS — R7989 Other specified abnormal findings of blood chemistry: Secondary | ICD-10-CM

## 2023-03-04 MED ORDER — CETIRIZINE HCL 10 MG PO TABS
10.0000 mg | ORAL_TABLET | Freq: Every day | ORAL | 3 refills | Status: DC
Start: 2023-03-04 — End: 2024-05-08

## 2023-03-04 MED ORDER — OLMESARTAN MEDOXOMIL-HCTZ 40-25 MG PO TABS: 1.0000 | ORAL_TABLET | Freq: Every day | ORAL | 1 refills | Status: DC

## 2023-03-04 MED ORDER — FENOFIBRATE 145 MG PO TABS
145.0000 mg | ORAL_TABLET | Freq: Every day | ORAL | 1 refills | Status: DC
Start: 2023-03-04 — End: 2023-09-03

## 2023-03-04 MED ORDER — OMEPRAZOLE 40 MG PO CPDR
40.0000 mg | DELAYED_RELEASE_CAPSULE | Freq: Every day | ORAL | 1 refills | Status: DC
Start: 2023-03-04 — End: 2023-09-03

## 2023-05-02 ENCOUNTER — Ambulatory Visit (INDEPENDENT_AMBULATORY_CARE_PROVIDER_SITE_OTHER): Payer: BC Managed Care – PPO | Admitting: Adult Health

## 2023-05-02 ENCOUNTER — Encounter: Payer: Self-pay | Admitting: Adult Health

## 2023-05-02 VITALS — BP 116/78 | HR 112 | Temp 97.6°F | Ht 71.0 in | Wt 212.0 lb

## 2023-05-02 DIAGNOSIS — R0683 Snoring: Secondary | ICD-10-CM | POA: Diagnosis not present

## 2023-05-02 NOTE — Patient Instructions (Signed)
Set up for home sleep study  Work on healthy weight  Do not drive if sleepy  Follow up in 6 weeks to discuss results and treatment plan

## 2023-05-02 NOTE — Assessment & Plan Note (Signed)
Loud snoring, restless sleep, daytime sleepiness all concerning for underlying sleep apnea.  Patient education on sleep apnea given will set patient up for home sleep study  - discussed how weight can impact sleep and risk for sleep disordered breathing - discussed options to assist with weight loss: combination of diet modification, cardiovascular and strength training exercises   - had an extensive discussion regarding the adverse health consequences related to untreated sleep disordered breathing - specifically discussed the risks for hypertension, coronary artery disease, cardiac dysrhythmias, cerebrovascular disease, and diabetes - lifestyle modification discussed   - discussed how sleep disruption can increase risk of accidents, particularly when driving - safe driving practices were discussed   Plan  Patient Instructions  Set up for home sleep study  Work on healthy weight  Do not drive if sleepy  Follow up in 6 weeks to discuss results and treatment plan

## 2023-05-02 NOTE — Progress Notes (Signed)
@Patient  ID: Danny Chase Parents, male    DOB: 05-10-1976, 47 y.o.   MRN: [id number]  Chief Complaint  Patient presents with   Consult    Referring provider: Alba Cory, MD  HPI: 47 year old male seen for sleep consult May 02, 2023 for snoring, restless sleep and daytime sleepiness  TEST/EVENTS :   05/02/2023 Sleep consult  Patient presents for sleep consult today.  Complains of snoring, restless sleep and daytime sleepiness.  Complains today for very tired the time.  Symptoms have been going on for a very long time.  Has very loud snoring.  Sleep is very fragmented.  At times gasps for air during his sleep.  Wakes up feeling tired and on refreshed.  Typically goes to bed about 10 PM takes up to 30 minutes to go to sleep.  Is up several times throughout the night.  Gets up at 8 AM.  Weight is stable over the last 2 years.  Current weight is at 212 pounds with a BMI of 29.  Has never had a sleep study before.  Does not nap.  Drinks 1 cup of caffeine daily.  Does not have any history of congestive heart failure or stroke occasionally uses trazodone for chronic insomnia.  Has no removable dental work.  Epworth score is 11.  No symptoms suspicious of cataplexy or sleep paralysis.    05/02/2023    2:06 PM  Results of the Epworth flowsheet  Sitting and reading 3  Watching TV 3  Sitting, inactive in a public place (e.g. a theatre or a meeting) 0  As a passenger in a car for an hour without a break 2  Lying down to rest in the afternoon when circumstances permit 2  Sitting and talking to someone 0  Sitting quietly after a lunch without alcohol 1  In a car, while stopped for a few minutes in traffic 0  Total score 11   Social history patient is single.  Lives at home alone.  Has children.  Is a never smoker.  Drinks 3 to 5 glasses of wine nightly.  Does not use any drugs.  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  Family history positive for allergies and cancer.    No Known  Allergies  Immunization History  Administered Date(s) Administered   Hepb-cpg 04/28/2020, 07/01/2020   Influenza, Seasonal, Injecte, Preservative Fre 03/04/2023   Influenza,inj,Quad PF,6+ Mos 07/13/2015, 03/18/2019, 03/10/2020, 03/01/2021   Influenza-Unspecified 03/20/2016, 03/18/2017   Moderna SARS-COV2 Booster Vaccination 03/16/2021   Moderna Sars-Covid-2 Vaccination 10/20/2019, 11/24/2019, 08/10/2020   Tdap 12/13/2016, 02/22/2023    Past Medical History:  Diagnosis Date   Allergy    Anxiety    Depression    Insomnia     Tobacco History: Social History   Tobacco Use  Smoking Status Never  Smokeless Tobacco Never   Counseling given: Not Answered   Outpatient Medications Prior to Visit  Medication Sig Dispense Refill   AUVELITY 45-105 MG TBCR Take 1 tablet by mouth daily at 12 noon.     Azelastine HCl 137 MCG/SPRAY SOLN USE 2 SPRAYS INTO EACH NOSTRIL ONCE DAILY     cetirizine (ZYRTEC) 10 MG tablet Take 1 tablet (10 mg total) by mouth daily. 90 tablet 3   fenofibrate (TRICOR) 145 MG tablet Take 1 tablet (145 mg total) by mouth daily. 90 tablet 1   fluticasone (FLONASE) 50 MCG/ACT nasal spray Place 2 sprays into both nostrils daily.  4   olmesartan-hydrochlorothiazide (BENICAR HCT) 40-25 MG tablet  Take 1 tablet by mouth daily. 90 tablet 1   omeprazole (PRILOSEC) 40 MG capsule Take 1 capsule (40 mg total) by mouth daily. 90 capsule 1   No facility-administered medications prior to visit.     Review of Systems:   Constitutional:   No  weight loss, night sweats,  Fevers, chills, +fatigue, or  lassitude.  HEENT:   No headaches,  Difficulty swallowing,  Tooth/dental problems, or  Sore throat,                No sneezing, itching, ear ache, nasal congestion, post nasal drip,   CV:  No chest pain,  Orthopnea, PND, swelling in lower extremities, anasarca, dizziness, palpitations, syncope.   GI  No heartburn, indigestion, abdominal pain, nausea, vomiting, diarrhea, change in  bowel habits, loss of appetite, bloody stools.   Resp: No shortness of breath with exertion or at rest.  No excess mucus, no productive cough,  No non-productive cough,  No coughing up of blood.  No change in color of mucus.  No wheezing.  No chest wall deformity  Skin: no rash or lesions.  GU: no dysuria, change in color of urine, no urgency or frequency.  No flank pain, no hematuria   MS:  No joint pain or swelling.  No decreased range of motion.  No back pain.    Physical Exam  BP 116/78 (BP Location: Left Arm, Patient Position: Sitting, Cuff Size: Normal)   Pulse (!) 112   Temp 97.6 F (36.4 C) (Temporal)   Ht 5\' 11"  (1.803 m)   Wt 212 lb (96.2 kg)   SpO2 96%   BMI 29.57 kg/m   GEN: A/Ox3; pleasant , NAD, well nourished    HEENT:  [place]/AT,  NOSE-clear, THROAT-clear, no lesions, no postnasal drip or exudate noted.  Class II-III MP airway  NECK:  Supple w/ fair ROM; no JVD; normal carotid impulses w/o bruits; no thyromegaly or nodules palpated; no lymphadenopathy.    RESP  Clear  P & A; w/o, wheezes/ rales/ or rhonchi. no accessory muscle use, no dullness to percussion  CARD:  RRR, no m/r/g, no peripheral edema, pulses intact, no cyanosis or clubbing.  GI:   Soft & nt; nml bowel sounds; no organomegaly or masses detected.   Musco: Warm bil, no deformities or joint swelling noted.   Neuro: alert, no focal deficits noted.    Skin: Warm, no lesions or rashes    Lab Results:  CBC    Component Value Date/Time   WBC 5.3 10/04/2022 0000   WBC 6.5 03/01/2021 1446   RBC 5.11 10/04/2022 0000   HGB 15.2 10/04/2022 0000   HCT 45 10/04/2022 0000   PLT 223 10/04/2022 0000   MCV 89.3 03/01/2021 1446   MCH 30.9 03/01/2021 1446   MCHC 34.6 03/01/2021 1446   RDW 13.0 03/01/2021 1446   LYMPHSABS 2,028 03/01/2021 1446   MONOABS 240 12/13/2016 1003   EOSABS 91 03/01/2021 1446   BASOSABS 20 03/01/2021 1446    BMET    Component Value Date/Time   NA 140 10/04/2022 0000    K 3.8 10/04/2022 0000   CL 103 10/04/2022 0000   CO2 28 03/01/2021 1446   GLUCOSE 112 (H) 03/01/2021 1446   BUN 12 10/04/2022 0000   CREATININE 1.0 10/04/2022 0000   CREATININE 0.91 03/01/2021 1446   CALCIUM 9.2 10/04/2022 0000   GFRNONAA 108 09/08/2019 0846   GFRNONAA 113 06/23/2018 0958   GFRAA 125 09/08/2019 0846  GFRAA 131 06/23/2018 0958    BNP No results found for: "BNP"  ProBNP No results found for: "PROBNP"  Imaging: No results found.  Administration History     None           No data to display          No results found for: "NITRICOXIDE"      Assessment & Plan:   Snoring Loud snoring, restless sleep, daytime sleepiness all concerning for underlying sleep apnea.  Patient education on sleep apnea given will set patient up for home sleep study  - discussed how weight can impact sleep and risk for sleep disordered breathing - discussed options to assist with weight loss: combination of diet modification, cardiovascular and strength training exercises   - had an extensive discussion regarding the adverse health consequences related to untreated sleep disordered breathing - specifically discussed the risks for hypertension, coronary artery disease, cardiac dysrhythmias, cerebrovascular disease, and diabetes - lifestyle modification discussed   - discussed how sleep disruption can increase risk of accidents, particularly when driving - safe driving practices were discussed   Plan  Patient Instructions  Set up for home sleep study  Work on healthy weight  Do not drive if sleepy  Follow up in 6 weeks to discuss results and treatment plan       Rubye Oaks, NP 05/02/2023

## 2023-05-17 DIAGNOSIS — R0683 Snoring: Secondary | ICD-10-CM

## 2023-05-17 DIAGNOSIS — G4730 Sleep apnea, unspecified: Secondary | ICD-10-CM | POA: Diagnosis not present

## 2023-05-20 DIAGNOSIS — W44F3XA Food entering into or through a natural orifice, initial encounter: Secondary | ICD-10-CM | POA: Diagnosis not present

## 2023-05-20 DIAGNOSIS — T18128A Food in esophagus causing other injury, initial encounter: Secondary | ICD-10-CM | POA: Diagnosis not present

## 2023-06-28 ENCOUNTER — Telehealth: Payer: BC Managed Care – PPO | Admitting: Adult Health

## 2023-06-28 ENCOUNTER — Encounter: Payer: Self-pay | Admitting: Adult Health

## 2023-06-28 DIAGNOSIS — G4733 Obstructive sleep apnea (adult) (pediatric): Secondary | ICD-10-CM

## 2023-06-28 NOTE — Patient Instructions (Addendum)
 Begin CPAP at bedtime goal is to wear all night long for at least 6 or more hours Work on healthy weight  Do not drive if sleepy  Follow up in 3 months and As needed

## 2023-06-28 NOTE — Progress Notes (Signed)
 Virtual Visit via Video Note  I connected with Danny Chase on 06/28/23 at  3:00 PM EST by a video enabled telemedicine application and verified that I am speaking with the correct person using two identifiers.  Location: Patient: Home  Provider: Office    I discussed the limitations of evaluation and management by telemedicine and the availability of in person appointments. The patient expressed understanding and agreed to proceed.  History of Present Illness: 48 year old male seen for sleep consult May 02, 2023 for snoring and daytime sleepiness found to have severe sleep apnea  Today's video visit is to discuss sleep study results.  Patient was seen last visit in November for a sleep consult for snoring and daytime sleepiness.  He was set up for a home sleep study that was done May 17, 2023 that showed severe sleep apnea with a AHI at 65.7/hour and SpO2 low at 83%.  We discussed his sleep study results in detail.  Went over treatment options including weight loss and CPAP therapy.  Patient is in agreement to begin CPAP therapy.  Patient has significant symptom burden with loud snoring, restless sleep and daytime sleepiness.  Past Medical History:  Diagnosis Date   Allergy    Anxiety    Depression    Insomnia     Current Outpatient Medications on File Prior to Visit  Medication Sig Dispense Refill   AUVELITY 45-105 MG TBCR Take 1 tablet by mouth daily at 12 noon.     Azelastine  HCl 137 MCG/SPRAY SOLN USE 2 SPRAYS INTO EACH NOSTRIL ONCE DAILY     cetirizine  (ZYRTEC ) 10 MG tablet Take 1 tablet (10 mg total) by mouth daily. 90 tablet 3   fenofibrate  (TRICOR ) 145 MG tablet Take 1 tablet (145 mg total) by mouth daily. 90 tablet 1   fluticasone (FLONASE) 50 MCG/ACT nasal spray Place 2 sprays into both nostrils daily.  4   olmesartan -hydrochlorothiazide (BENICAR  HCT) 40-25 MG tablet Take 1 tablet by mouth daily. 90 tablet 1   omeprazole  (PRILOSEC) 40 MG capsule Take 1 capsule  (40 mg total) by mouth daily. 90 capsule 1   No current facility-administered medications on file prior to visit.       Observations/Objective:  Home sleep study May 17, 2023 severe sleep apnea with AHI at 65.7/hour and SpO2 low at 83%  Assessment and Plan: Severe obstructive sleep apnea with significant symptom burden.  Patient will begin CPAP therapy.  Patient education was given on sleep apnea and CPAP therapy  - discussed how weight can impact sleep and risk for sleep disordered breathing - discussed options to assist with weight loss: combination of diet modification, cardiovascular and strength training exercises   - had an extensive discussion regarding the adverse health consequences related to untreated sleep disordered breathing - specifically discussed the risks for hypertension, coronary artery disease, cardiac dysrhythmias, cerebrovascular disease, and diabetes - lifestyle modification discussed   - discussed how sleep disruption can increase risk of accidents, particularly when driving - safe driving practices were discussed   Plan  Patient Instructions  Begin CPAP at bedtime goal is to wear all night long for at least 6 or more hours Work on healthy weight  Do not drive if sleepy  Follow up in 3 months and As needed      Follow Up Instructions:    I discussed the assessment and treatment plan with the patient. The patient was provided an opportunity to ask questions and all were answered. The patient  agreed with the plan and demonstrated an understanding of the instructions.   The patient was advised to call back or seek an in-person evaluation if the symptoms worsen or if the condition fails to improve as anticipated.  I provided 22 minutes of non-face-to-face time during this encounter.   Madelin Stank, NP

## 2023-07-10 DIAGNOSIS — G4733 Obstructive sleep apnea (adult) (pediatric): Secondary | ICD-10-CM | POA: Diagnosis not present

## 2023-07-26 ENCOUNTER — Encounter: Payer: Self-pay | Admitting: Adult Health

## 2023-07-26 ENCOUNTER — Telehealth (INDEPENDENT_AMBULATORY_CARE_PROVIDER_SITE_OTHER): Payer: BC Managed Care – PPO | Admitting: Adult Health

## 2023-07-26 DIAGNOSIS — G4733 Obstructive sleep apnea (adult) (pediatric): Secondary | ICD-10-CM

## 2023-07-26 NOTE — Patient Instructions (Signed)
 Continue on CPAP at bedtime goal is to wear all night long for at least 6 or more hours Work on healthy weight  Do not drive if sleepy  Follow up in 2 months and As needed

## 2023-07-26 NOTE — Progress Notes (Signed)
 Virtual Visit via Video Note  I connected with Danny Chase on 07/26/23 at  2:00 PM EST by a video enabled telemedicine application and verified that I am speaking with the correct person using two identifiers.  Location: Patient: Home Provider: Office    I discussed the limitations of evaluation and management by telemedicine and the availability of in person appointments. The patient expressed understanding and agreed to proceed.  History of Present Illness: 48 yo male seen for sleep consult 04/2023 for snoring found to have severe OSA   Today video visit is for a 1 month follow up for severe OSA . Seen last visit after home sleep study showed severe OSA with AHI at 65.7/hr and SpO2 low 83%.  Patient was started on CPAP last visit.  Patient says he just received his CPAP about 2 weeks ago he is trying to get used to it he initially started with a DreamWear full facemask and then switched to a complete fullface mask patient says that he is went back to the DreamWear full facemask and seems to be more comfortable.  He is getting about 5 to 6 hours.  Says that he does feel like it is helping some.  His download shows over the last 2 weeks good compliance.  Daily average usage at 6 hours.  Patient is on auto CPAP 5 to 15 cm H2O.  Daily average pressure 10.1 cm H2O.  AHI 3.7/hour    Observations/Objective: home sleep study that was done May 17, 2023 that showed severe sleep apnea with a AHI at 65.7/hour and SpO2 low at 83%   Assessment and Plan: Severe obstructive sleep apnea with perceived benefit on CPAP.  Patient has very good control on CPAP.  Continue to wear each night for at least 6 or more hours.  CPAP care discussed in detail.  Plan  Patient Instructions  Continue on CPAP at bedtime goal is to wear all night long for at least 6 or more hours Work on healthy weight  Do not drive if sleepy  Follow up in 2 months and As needed      Follow Up Instructions:    I discussed  the assessment and treatment plan with the patient. The patient was provided an opportunity to ask questions and all were answered. The patient agreed with the plan and demonstrated an understanding of the instructions.   The patient was advised to call back or seek an in-person evaluation if the symptoms worsen or if the condition fails to improve as anticipated.  I provided 22 minutes of non-face-to-face time during this encounter.   Madelin Stank, NP

## 2023-08-10 DIAGNOSIS — G4733 Obstructive sleep apnea (adult) (pediatric): Secondary | ICD-10-CM | POA: Diagnosis not present

## 2023-09-01 ENCOUNTER — Other Ambulatory Visit: Payer: Self-pay | Admitting: Family Medicine

## 2023-09-01 DIAGNOSIS — K219 Gastro-esophageal reflux disease without esophagitis: Secondary | ICD-10-CM

## 2023-09-01 DIAGNOSIS — E781 Pure hyperglyceridemia: Secondary | ICD-10-CM

## 2023-09-01 DIAGNOSIS — I10 Essential (primary) hypertension: Secondary | ICD-10-CM

## 2023-09-03 ENCOUNTER — Ambulatory Visit: Payer: BC Managed Care – PPO | Admitting: Family Medicine

## 2023-09-03 VITALS — BP 106/74 | HR 96 | Resp 16 | Ht 71.0 in | Wt 215.2 lb

## 2023-09-03 DIAGNOSIS — Z1211 Encounter for screening for malignant neoplasm of colon: Secondary | ICD-10-CM

## 2023-09-03 DIAGNOSIS — I10 Essential (primary) hypertension: Secondary | ICD-10-CM | POA: Diagnosis not present

## 2023-09-03 DIAGNOSIS — E781 Pure hyperglyceridemia: Secondary | ICD-10-CM | POA: Insufficient documentation

## 2023-09-03 DIAGNOSIS — J302 Other seasonal allergic rhinitis: Secondary | ICD-10-CM

## 2023-09-03 DIAGNOSIS — G4733 Obstructive sleep apnea (adult) (pediatric): Secondary | ICD-10-CM

## 2023-09-03 DIAGNOSIS — F330 Major depressive disorder, recurrent, mild: Secondary | ICD-10-CM

## 2023-09-03 DIAGNOSIS — E559 Vitamin D deficiency, unspecified: Secondary | ICD-10-CM

## 2023-09-03 DIAGNOSIS — F1020 Alcohol dependence, uncomplicated: Secondary | ICD-10-CM

## 2023-09-03 DIAGNOSIS — J3089 Other allergic rhinitis: Secondary | ICD-10-CM

## 2023-09-03 DIAGNOSIS — K219 Gastro-esophageal reflux disease without esophagitis: Secondary | ICD-10-CM

## 2023-09-03 MED ORDER — OLMESARTAN MEDOXOMIL-HCTZ 40-12.5 MG PO TABS: 1.0000 | ORAL_TABLET | Freq: Every day | ORAL | 0 refills | Status: DC

## 2023-09-03 MED ORDER — OMEPRAZOLE 40 MG PO CPDR: 40.0000 mg | DELAYED_RELEASE_CAPSULE | Freq: Every day | ORAL | 1 refills | Status: DC

## 2023-09-03 MED ORDER — FENOFIBRATE 145 MG PO TABS: 145.0000 mg | ORAL_TABLET | Freq: Every day | ORAL | 1 refills | Status: DC

## 2023-09-03 MED ORDER — AZELASTINE HCL 137 MCG/SPRAY NA SOLN: 2.0000 | Freq: Two times a day (BID) | NASAL | 2 refills | Status: DC | PRN

## 2023-09-03 NOTE — Progress Notes (Signed)
 Name: Danny Chase   MRN: [id number]    DOB: 09-Nov-1975   Date:09/03/2023       Progress Note  Subjective  Chief Complaint  Chief Complaint  Patient presents with   Medical Management of Chronic Issues   HPI   Alcohol use: AUDIT  was positive in the past Heavy drinker for about 21 years. He is drinking daily again, usually wine now, most of the time 2-4   per day    Mild Major Depression: sees Dr. Evelene Croon in [place] about once a year . He is now off Pritiq and Trazodone ( but still has some of the medication at home)  he was taking  Auvelity but recently switched back to Pristiq and will follow up with psychiatrist   OSA on CPAP: severe, using CPAP still has difficulty tolerating the mask but trying to use it every night    HTN: he is taking Benicar hydrochlorothiazide 40/25 mg daily , no chest pain or palpitation, BP is towards low end of normal we will try to adjust dose to benicar hydrochlorothiazide 40/12.5 mg   Hypertriglyceridemia:   He is taking fenofibrate daily . He will have labs done at work next month and will send me the results    Elevated lft's: seen by Dr. Tawni Carnes in the past , last hepatic function was normal.  He is only drinking wine today, still 2-4 glasses per day . We will continue to monitor    Elevated TSH: last level was back to normal range    GERD: under control with omeprazole, no heartburn or indigestion. He needs a refill. He can tell when he skips medication for a few days, causes sore throat and some symptoms of dysphagia  Patient Active Problem List   Diagnosis Date Noted   Snoring 05/02/2023   Elevated TSH 02/27/2022   GERD without esophagitis 02/27/2022   Thrombocytopenia (HCC) 06/23/2018   Perennial allergic rhinitis with seasonal variation 12/13/2017   Vitamin D insufficiency 12/13/2017   Alcohol use disorder, moderate, dependence (HCC) 12/13/2017   Psoriasis 12/13/2017   Mild recurrent major depression (HCC) 07/13/2015   Insomnia  07/13/2015   Essential hypertension 07/13/2015    History reviewed. No pertinent surgical history.  Family History  Problem Relation Age of Onset   Cancer Mother        Lymphoma   Stroke Mother    Cancer Father        Lymphoma, Colon CA   Hypertension Brother     Social History   Tobacco Use   Smoking status: Never   Smokeless tobacco: Never  Substance Use Topics   Alcohol use: Yes    Alcohol/week: 7.0 standard drinks of alcohol    Types: 7 Glasses of wine per week     Current Outpatient Medications:    Azelastine HCl 137 MCG/SPRAY SOLN, USE 2 SPRAYS INTO EACH NOSTRIL ONCE DAILY, Disp: , Rfl:    cetirizine (ZYRTEC) 10 MG tablet, Take 1 tablet (10 mg total) by mouth daily., Disp: 90 tablet, Rfl: 3   fenofibrate (TRICOR) 145 MG tablet, Take 1 tablet (145 mg total) by mouth daily., Disp: 90 tablet, Rfl: 1   fluticasone (FLONASE) 50 MCG/ACT nasal spray, Place 2 sprays into both nostrils daily., Disp: , Rfl: 4   olmesartan-hydrochlorothiazide (BENICAR HCT) 40-25 MG tablet, Take 1 tablet by mouth daily., Disp: 90 tablet, Rfl: 1   omeprazole (PRILOSEC) 40 MG capsule, Take 1 capsule (40 mg total) by mouth daily., Disp: 90 capsule,  Rfl: 1   AUVELITY 45-105 MG TBCR, Take 1 tablet by mouth daily at 12 noon. (Patient not taking: Reported on 09/03/2023), Disp: , Rfl:    desvenlafaxine (PRISTIQ) 50 MG 24 hr tablet, Take 50 mg by mouth every morning. (Patient not taking: Reported on 09/03/2023), Disp: , Rfl:   No Known Allergies  I personally reviewed active problem list, medication list, allergies, family history with the patient/caregiver today.   ROS  Ten systems reviewed and is negative except as mentioned in HPI    Objective  Vitals:   09/03/23 1524  BP: 106/74  Pulse: 96  Resp: 16  SpO2: 97%  Weight: 215 lb 3.2 oz (97.6 kg)  Height: 5\' 11"  (1.803 m)    Body mass index is 30.01 kg/m.  Physical Exam  Constitutional: Patient appears well-developed and well-nourished.  Obese  No distress.  HEENT: head atraumatic, normocephalic, pupils equal and reactive to light, neck supple Cardiovascular: Normal rate, regular rhythm and normal heart sounds.  No murmur heard. No BLE edema. Pulmonary/Chest: Effort normal and breath sounds normal. No respiratory distress. Abdominal: Soft.  There is no tenderness. Psychiatric: Patient has a normal mood and affect. behavior is normal. Judgment and thought content normal.   Diabetic Foot Exam:     PHQ2/9:    09/03/2023    3:23 PM 03/04/2023    3:38 PM 08/28/2022    2:41 PM 02/27/2022    3:10 PM 08/29/2021    2:47 PM  Depression screen PHQ 2/9  Decreased Interest 0 0 0 0 0  Down, Depressed, Hopeless 0 0 0 0 0  PHQ - 2 Score 0 0 0 0 0  Altered sleeping 0 0 0 0 0  Tired, decreased energy 0 0 0 0 0  Change in appetite 0 0 0 0 0  Feeling bad or failure about yourself  0 0 0 0 0  Trouble concentrating 0 0 0 0 0  Moving slowly or fidgety/restless 0 0 0 0 0  Suicidal thoughts 0 0 0 0 0  PHQ-9 Score 0 0 0 0 0  Difficult doing work/chores Not difficult at all        phq 9 is negative  Fall Risk:    03/04/2023    3:38 PM 08/28/2022    2:31 PM 02/27/2022    3:10 PM 08/29/2021    2:47 PM 03/01/2021    2:19 PM  Fall Risk   Falls in the past year? 0 0 0 0 0  Number falls in past yr:   0 0 0  Injury with Fall?   0 0 0  Risk for fall due to : No Fall Risks No Fall Risks No Fall Risks No Fall Risks   Follow up Falls prevention discussed Falls prevention discussed Falls prevention discussed Falls prevention discussed      Assessment & Plan  1. OSA on CPAP (Primary)  Seeing pulmonologist   2. Mild recurrent major depression [facility])  Seeing psychiatrist   3. Alcohol use disorder, moderate, dependence (HCC)  Discussed ways to quit   4. Essential hypertension  - olmesartan-hydrochlorothiazide (BENICAR HCT) 40-12.5 MG tablet; Take 1 tablet by mouth daily.  Dispense: 90 tablet; Refill: 0  5. Perennial allergic rhinitis  with seasonal variation  - Azelastine HCl 137 MCG/SPRAY SOLN; Place 2 sprays into both nostrils 2 (two) times daily as needed.  Dispense: 30 mL; Refill: 2  6. GERD without esophagitis  - omeprazole (PRILOSEC) 40 MG capsule; Take 1 capsule (40  mg total) by mouth daily.  Dispense: 90 capsule; Refill: 1  7. Vitamin D insufficiency  Continue supplementation   8. Hypertriglyceridemia  - fenofibrate (TRICOR) 145 MG tablet; Take 1 tablet (145 mg total) by mouth daily.  Dispense: 90 tablet; Refill: 1  9. Colon cancer screening  - Ambulatory referral to Gastroenterology

## 2023-09-05 DIAGNOSIS — F3342 Major depressive disorder, recurrent, in full remission: Secondary | ICD-10-CM | POA: Diagnosis not present

## 2023-09-07 DIAGNOSIS — G4733 Obstructive sleep apnea (adult) (pediatric): Secondary | ICD-10-CM | POA: Diagnosis not present

## 2023-09-19 DIAGNOSIS — Z0189 Encounter for other specified special examinations: Secondary | ICD-10-CM | POA: Diagnosis not present

## 2023-09-19 LAB — BASIC METABOLIC PANEL WITH GFR
BUN: 16 (ref 4–21)
Chloride: 101 (ref 99–108)
Creatinine: 1.1 (ref 0.6–1.3)
Glucose: 75
Potassium: 4.2 meq/L (ref 3.5–5.1)
Sodium: 139 (ref 137–147)

## 2023-09-19 LAB — LIPID PANEL
Cholesterol: 181 (ref 0–200)
HDL: 42 (ref 35–70)
LDL Cholesterol: 94
Triglycerides: 268 — AB (ref 40–160)

## 2023-09-19 LAB — HEPATIC FUNCTION PANEL
ALT: 23 U/L (ref 10–40)
AST: 19 (ref 14–40)
Alkaline Phosphatase: 77 (ref 25–125)
Bilirubin, Total: 0.2

## 2023-09-19 LAB — VITAMIN B12: Vitamin B-12: 173

## 2023-09-19 LAB — COMPREHENSIVE METABOLIC PANEL WITH GFR
Albumin: 4.3 (ref 3.5–5.0)
Calcium: 9.2 (ref 8.7–10.7)
Globulin: 2.8

## 2023-09-19 LAB — TSH: TSH: 3.55 (ref 0.41–5.90)

## 2023-09-19 LAB — VITAMIN D 25 HYDROXY (VIT D DEFICIENCY, FRACTURES): Vit D, 25-Hydroxy: 22.8

## 2023-09-19 LAB — HEMOGLOBIN A1C: Hemoglobin A1C: 5.3

## 2023-09-20 LAB — LAB REPORT - SCANNED: A1c: 5.3

## 2023-09-24 DIAGNOSIS — E785 Hyperlipidemia, unspecified: Secondary | ICD-10-CM | POA: Diagnosis not present

## 2023-09-24 DIAGNOSIS — Z043 Encounter for examination and observation following other accident: Secondary | ICD-10-CM | POA: Diagnosis not present

## 2023-11-27 ENCOUNTER — Encounter: Payer: Self-pay | Admitting: Family Medicine

## 2023-11-27 ENCOUNTER — Ambulatory Visit (INDEPENDENT_AMBULATORY_CARE_PROVIDER_SITE_OTHER): Admitting: Family Medicine

## 2023-11-27 VITALS — BP 122/84 | HR 94 | Temp 98.0°F | Ht 71.0 in | Wt 209.0 lb

## 2023-11-27 DIAGNOSIS — Z0000 Encounter for general adult medical examination without abnormal findings: Secondary | ICD-10-CM | POA: Diagnosis not present

## 2023-11-27 NOTE — Progress Notes (Signed)
 Name: Danny Chase   MRN: [id number]    DOB: Oct 17, 1975   Date:11/27/2023       Progress Note  Subjective  Chief Complaint  Chief Complaint  Patient presents with   Annual Exam    HPI  Patient presents for annual CPE .   IPSS     Row Name 11/27/23 1535         International Prostate Symptom Score   How often have you had the sensation of not emptying your bladder? Not at All     How often have you had to urinate less than every two hours? Not at All     How often have you found you stopped and started again several times when you urinated? Not at All     How often have you found it difficult to postpone urination? Less than 1 in 5 times     How often have you had a weak urinary stream? Not at All     How often have you had to strain to start urination? Not at All     How many times did you typically get up at night to urinate? 1 Time     Total IPSS Score 2       Quality of Life due to urinary symptoms   If you were to spend the rest of your life with your urinary condition just the way it is now how would you feel about that? Delighted              Diet: discussed healthy diet  Exercise: discussed 150 minutes per week  Last Dental Exam: up to date Last Eye Exam: up to date   Depression: phq 9 is negative    11/27/2023    3:25 PM 09/03/2023    3:23 PM 03/04/2023    3:38 PM 08/28/2022    2:41 PM 02/27/2022    3:10 PM  Depression screen PHQ 2/9  Decreased Interest 0 0 0 0 0  Down, Depressed, Hopeless 0 0 0 0 0  PHQ - 2 Score 0 0 0 0 0  Altered sleeping 0 0 0 0 0  Tired, decreased energy 0 0 0 0 0  Change in appetite 0 0 0 0 0  Feeling bad or failure about yourself  0 0 0 0 0  Trouble concentrating 0 0 0 0 0  Moving slowly or fidgety/restless 0 0 0 0 0  Suicidal thoughts 0 0 0 0 0  PHQ-9 Score 0 0 0 0 0  Difficult doing work/chores Not difficult at all Not difficult at all       Hypertension:  BP Readings from Last 3 Encounters:  11/27/23 122/84   09/03/23 106/74  05/02/23 116/78    Obesity: Wt Readings from Last 3 Encounters:  11/27/23 209 lb (94.8 kg)  09/03/23 215 lb 3.2 oz (97.6 kg)  05/02/23 212 lb (96.2 kg)   BMI Readings from Last 3 Encounters:  11/27/23 29.15 kg/m  09/03/23 30.01 kg/m  05/02/23 29.57 kg/m     Flowsheet Row Office Visit from 11/27/2023 in [facility]  AUDIT-C Score 6       Single STD testing and prevention (HIV/chl/gon/syphilis):  not interested  Sexual history: not lately, previous partner was mother of her son  Hep C Screening: completed Skin cancer: Discussed monitoring for atypical lesions Colorectal cancer: he has a consultation coming up  Prostate cancer:  not applicable Lab Results  Component Value Date  PSA 0.5 12/09/2020   PSA 0.6 07/29/2019   PSA 0.6 12/13/2016     Lung cancer:  Low Dose CT Chest recommended if Age 83-80 years, 30 pack-year currently smoking OR have quit w/in 15years. Patient  is not a candidate for screening   AAA: The USPSTF recommends one-time screening with ultrasonography in men ages 22 to 75 years who have ever smoked. Patient   is not a candidate for screening  ECG:  2019  Vaccines: reviewed with the patient.   Advanced Care Planning: A voluntary discussion about advance care planning including the explanation and discussion of advance directives.  Discussed health care proxy and Living will, and the patient was able to identify a health care proxy as mother .  Patient does not have a living will and power of attorney of health care   Patient Active Problem List   Diagnosis Date Noted   Hypertriglyceridemia 09/03/2023   Snoring 05/02/2023   GERD without esophagitis 02/27/2022   Perennial allergic rhinitis with seasonal variation 12/13/2017   Vitamin D  insufficiency 12/13/2017   Alcohol use disorder, moderate, dependence (HCC) 12/13/2017   Psoriasis 12/13/2017   Mild recurrent major depression (HCC) 07/13/2015    Insomnia 07/13/2015   Essential hypertension 07/13/2015    History reviewed. No pertinent surgical history.  Family History  Problem Relation Age of Onset   Cancer Mother        Lymphoma   Stroke Mother    Cancer Father        Lymphoma, Colon CA   Hypertension Brother     Social History   Socioeconomic History   Marital status: Single    Spouse name: Not on file   Number of children: 1   Years of education: Not on file   Highest education level: Bachelor's degree (e.g., BA, AB, BS)  Occupational History   Occupation: Charity fundraiser     Comment: [employer]   Tobacco Use   Smoking status: Never   Smokeless tobacco: Never  Vaping Use   Vaping status: Never Used  Substance and Sexual Activity   Alcohol use: Yes    Alcohol/week: 7.0 standard drinks of alcohol    Types: 7 Glasses of wine per week   Drug use: No   Sexual activity: Yes  Other Topics Concern   Not on file  Social History Narrative   He lives alone, has a son, sees his son every weekend also brother and parents weekly   He plays guitar    Social Drivers of Corporate investment banker Strain: Low Risk  (11/27/2023)   Overall Financial Resource Strain (CARDIA)    Difficulty of Paying Living Expenses: Not hard at all  Food Insecurity: No Food Insecurity (11/27/2023)   Hunger Vital Sign    Worried About Running Out of Food in the Last Year: Never true    Ran Out of Food in the Last Year: Never true  Transportation Needs: No Transportation Needs (11/27/2023)   PRAPARE - Administrator, Civil Service (Medical): No    Lack of Transportation (Non-Medical): No  Physical Activity: Insufficiently Active (11/27/2023)   Exercise Vital Sign    Days of Exercise per Week: 1 day    Minutes of Exercise per Session: 60 min  Stress: Stress Concern Present (11/27/2023)   [employer] of Occupational Health - Occupational Stress Questionnaire    Feeling of Stress : To some extent  Social Connections:  Socially Isolated (09/03/2023)   Social Connection  and Isolation Panel [NHANES]    Frequency of Communication with Friends and Family: Once a week    Frequency of Social Gatherings with Friends and Family: Once a week    Attends Religious Services: Never    Database administrator or Organizations: No    Attends Engineer, structural: Not on file    Marital Status: Living with partner  Intimate Partner Violence: Not At Risk (11/27/2023)   Humiliation, Afraid, Rape, and Kick questionnaire    Fear of Current or Ex-Partner: No    Emotionally Abused: No    Physically Abused: No    Sexually Abused: No     Current Outpatient Medications:    Azelastine  HCl 137 MCG/SPRAY SOLN, Place 2 sprays into both nostrils 2 (two) times daily as needed., Disp: 30 mL, Rfl: 2   cetirizine  (ZYRTEC ) 10 MG tablet, Take 1 tablet (10 mg total) by mouth daily., Disp: 90 tablet, Rfl: 3   desvenlafaxine  (PRISTIQ ) 50 MG 24 hr tablet, Take 50 mg by mouth every morning., Disp: , Rfl:    fenofibrate  (TRICOR ) 145 MG tablet, Take 1 tablet (145 mg total) by mouth daily., Disp: 90 tablet, Rfl: 1   olmesartan -hydrochlorothiazide (BENICAR  HCT) 40-12.5 MG tablet, Take 1 tablet by mouth daily., Disp: 90 tablet, Rfl: 0   omeprazole  (PRILOSEC) 40 MG capsule, Take 1 capsule (40 mg total) by mouth daily., Disp: 90 capsule, Rfl: 1   traZODone  (DESYREL ) 100 MG tablet, Take 100 mg by mouth at bedtime., Disp: , Rfl:    AUVELITY 45-105 MG TBCR, Take 1 tablet by mouth daily at 12 noon. (Patient not taking: Reported on 11/27/2023), Disp: , Rfl:   No Known Allergies   ROS  Constitutional: Negative for fever , positive for weight change.  Respiratory: Negative for cough and shortness of breath.   Cardiovascular: Negative for chest pain or palpitations.  Gastrointestinal: Negative for abdominal pain, no bowel changes.  Musculoskeletal: Negative for gait problem or joint swelling.  Skin: Negative for rash.  Neurological: Negative  for dizziness or headache.  No other specific complaints in a complete review of systems (except as listed in HPI above).    Objective  Vitals:   11/27/23 1516  BP: 122/84  Pulse: 94  Temp: 98 F (36.7 C)  TempSrc: Oral  SpO2: 97%  Weight: 209 lb (94.8 kg)  Height: 5' 11 (1.803 m)    Body mass index is 29.15 kg/m.  Physical Exam  Constitutional: Patient appears well-developed and well-nourished. No distress.  HENT: Head: Normocephalic and atraumatic. Ears: B TMs ok, no erythema or effusion; Nose: Nose normal. Mouth/Throat: Oropharynx is clear and moist. No oropharyngeal exudate.  Eyes: Conjunctivae and EOM are normal. Pupils are equal, round, and reactive to light. No scleral icterus.  Neck: Normal range of motion. Neck supple. No JVD present. No thyromegaly present.  Cardiovascular: Normal rate, regular rhythm and normal heart sounds.  No murmur heard. No BLE edema. Pulmonary/Chest: Effort normal and breath sounds normal. No respiratory distress. Abdominal: Soft. Bowel sounds are normal, no distension. There is no tenderness. no masses MALE GENITALIA: Normal descended testes bilaterally, no masses palpated, no hernias, no lesions, no discharge RECTAL: not done  Musculoskeletal: Normal range of motion, no joint effusions. No gross deformities Neurological: he is alert and oriented to person, place, and time. No cranial nerve deficit. Coordination, balance, strength, speech and gait are normal.  Skin: Skin is warm and dry. No rash noted. No erythema.  Psychiatric: Patient has a  normal mood and affect. behavior is normal. Judgment and thought content normal.     Assessment & Plan  1. Well adult exam (Primary)  He had labs done at work and will send me the results    -Prostate cancer screening and PSA options (with potential risks and benefits of testing vs not testing) were discussed along with recent recs/guidelines. -USPSTF grade A and B recommendations reviewed with  patient; age-appropriate recommendations, preventive care, screening tests, etc discussed and encouraged; healthy living encouraged; see AVS for patient education given to patient -Discussed importance of 150 minutes of physical activity weekly, eat two servings of fish weekly, eat one serving of tree nuts ( cashews, pistachios, pecans, almonds.Aaron Aas) every other day, eat 6 servings of fruit/vegetables daily and drink plenty of water and avoid sweet beverages.  -Reviewed Health Maintenance: yes

## 2023-11-28 ENCOUNTER — Encounter: Payer: Self-pay | Admitting: Family Medicine

## 2023-11-29 ENCOUNTER — Encounter: Payer: Self-pay | Admitting: Family Medicine

## 2023-12-01 ENCOUNTER — Other Ambulatory Visit: Payer: Self-pay | Admitting: Family Medicine

## 2023-12-01 DIAGNOSIS — I10 Essential (primary) hypertension: Secondary | ICD-10-CM

## 2023-12-02 NOTE — Telephone Encounter (Signed)
 Requested Prescriptions  Pending Prescriptions Disp Refills   olmesartan -hydrochlorothiazide (BENICAR  HCT) 40-12.5 MG tablet [Pharmacy Med Name: OLMESARTAN -HCTZ 40-12.5 MG TAB] 90 tablet 1    Sig: TAKE 1 TABLET BY MOUTH EVERY DAY     Cardiovascular: ARB + Diuretic Combos Passed - 12/02/2023  9:53 AM      Passed - K in normal range and within 180 days    Potassium  Date Value Ref Range Status  09/19/2023 4.2 3.5 - 5.1 mEq/L Final         Passed - Na in normal range and within 180 days    Sodium  Date Value Ref Range Status  09/19/2023 139 137 - 147 Final         Passed - Cr in normal range and within 180 days    Creatinine  Date Value Ref Range Status  09/19/2023 1.1 0.6 - 1.3 Final   Creat  Date Value Ref Range Status  03/01/2021 0.91 0.60 - 1.29 mg/dL Final         Passed - eGFR is 10 or above and within 180 days    GFR, Est African American  Date Value Ref Range Status  06/23/2018 131 > OR = 60 mL/min/1.75m2 Final   GFR calc Af Amer  Date Value Ref Range Status  09/08/2019 125 >59 mL/min/1.73 Final   GFR, Est Non African American  Date Value Ref Range Status  06/23/2018 113 > OR = 60 mL/min/1.65m2 Final   GFR calc non Af Amer  Date Value Ref Range Status  09/08/2019 108 >59 mL/min/1.73 Final   eGFR  Date Value Ref Range Status  10/04/2022 91  Final   EGFR  Date Value Ref Range Status  09/24/2023 80.0  Final    Comment:    Abstracted by HIM         Passed - Patient is not pregnant      Passed - Last BP in normal range    BP Readings from Last 1 Encounters:  11/27/23 122/84         Passed - Valid encounter within last 6 months    Recent Outpatient Visits           5 days ago Well adult exam   [facility] Arleen Lacer, MD   3 months ago OSA on CPAP   [facility] Arleen Lacer, MD       Future Appointments             In 3 months Sowles, Krichna, MD [facility]

## 2024-01-15 ENCOUNTER — Other Ambulatory Visit: Payer: Self-pay | Admitting: Family Medicine

## 2024-01-15 DIAGNOSIS — J302 Other seasonal allergic rhinitis: Secondary | ICD-10-CM

## 2024-02-11 DIAGNOSIS — G4733 Obstructive sleep apnea (adult) (pediatric): Secondary | ICD-10-CM | POA: Diagnosis not present

## 2024-02-11 DIAGNOSIS — K219 Gastro-esophageal reflux disease without esophagitis: Secondary | ICD-10-CM | POA: Diagnosis not present

## 2024-02-11 DIAGNOSIS — Z800 Family history of malignant neoplasm of digestive organs: Secondary | ICD-10-CM | POA: Diagnosis not present

## 2024-02-24 ENCOUNTER — Ambulatory Visit: Payer: Self-pay

## 2024-02-24 DIAGNOSIS — D123 Benign neoplasm of transverse colon: Secondary | ICD-10-CM | POA: Diagnosis not present

## 2024-02-24 DIAGNOSIS — Z1211 Encounter for screening for malignant neoplasm of colon: Secondary | ICD-10-CM | POA: Diagnosis not present

## 2024-02-24 DIAGNOSIS — Z800 Family history of malignant neoplasm of digestive organs: Secondary | ICD-10-CM | POA: Diagnosis not present

## 2024-02-24 DIAGNOSIS — D126 Benign neoplasm of colon, unspecified: Secondary | ICD-10-CM | POA: Diagnosis not present

## 2024-02-24 DIAGNOSIS — K635 Polyp of colon: Secondary | ICD-10-CM | POA: Diagnosis not present

## 2024-02-27 ENCOUNTER — Other Ambulatory Visit: Payer: Self-pay | Admitting: Family Medicine

## 2024-02-27 DIAGNOSIS — E781 Pure hyperglyceridemia: Secondary | ICD-10-CM

## 2024-02-27 DIAGNOSIS — K219 Gastro-esophageal reflux disease without esophagitis: Secondary | ICD-10-CM

## 2024-03-09 ENCOUNTER — Ambulatory Visit: Admitting: Family Medicine

## 2024-03-26 ENCOUNTER — Other Ambulatory Visit: Payer: Self-pay | Admitting: Family Medicine

## 2024-03-26 DIAGNOSIS — E781 Pure hyperglyceridemia: Secondary | ICD-10-CM

## 2024-03-26 DIAGNOSIS — K219 Gastro-esophageal reflux disease without esophagitis: Secondary | ICD-10-CM

## 2024-03-29 ENCOUNTER — Other Ambulatory Visit: Payer: Self-pay | Admitting: Family Medicine

## 2024-03-29 DIAGNOSIS — K219 Gastro-esophageal reflux disease without esophagitis: Secondary | ICD-10-CM

## 2024-03-29 DIAGNOSIS — E781 Pure hyperglyceridemia: Secondary | ICD-10-CM

## 2024-04-15 ENCOUNTER — Other Ambulatory Visit: Payer: Self-pay | Admitting: Family Medicine

## 2024-04-15 DIAGNOSIS — J302 Other seasonal allergic rhinitis: Secondary | ICD-10-CM

## 2024-04-15 NOTE — Telephone Encounter (Signed)
 March appt sch'd

## 2024-05-06 ENCOUNTER — Other Ambulatory Visit: Payer: Self-pay | Admitting: Family Medicine

## 2024-05-06 DIAGNOSIS — J302 Other seasonal allergic rhinitis: Secondary | ICD-10-CM

## 2024-05-08 NOTE — Telephone Encounter (Signed)
 Requested Prescriptions  Pending Prescriptions Disp Refills   cetirizine  (ZYRTEC ) 10 MG tablet [Pharmacy Med Name: CETIRIZINE  HCL 10 MG TABLET] 90 tablet 1    Sig: TAKE 1 TABLET BY MOUTH EVERY DAY     Ear, Nose, and Throat:  Antihistamines 2 Passed - 05/08/2024 12:41 PM      Passed - Cr in normal range and within 360 days    Creatinine  Date Value Ref Range Status  09/19/2023 1.1 0.6 - 1.3 Final   Creat  Date Value Ref Range Status  03/01/2021 0.91 0.60 - 1.29 mg/dL Final         Passed - Valid encounter within last 12 months    Recent Outpatient Visits           5 months ago Well adult exam   [facility] Glenard Mire, MD   8 months ago OSA on CPAP   [facility] Glenard Mire, MD

## 2024-05-29 ENCOUNTER — Other Ambulatory Visit: Payer: Self-pay | Admitting: Family Medicine

## 2024-05-29 DIAGNOSIS — I10 Essential (primary) hypertension: Secondary | ICD-10-CM

## 2024-06-01 NOTE — Telephone Encounter (Signed)
 Requested medications are due for refill today.  yes  Requested medications are on the active medications list.  yes  Last refill. 12/02/2023 #90 1 rf  Future visit scheduled.   yes  Notes to clinic.  Labs are expired.    Requested Prescriptions  Pending Prescriptions Disp Refills   olmesartan -hydrochlorothiazide (BENICAR  HCT) 40-12.5 MG tablet [Pharmacy Med Name: OLMESARTAN -HCTZ 40-12.5 MG TAB] 90 tablet 1    Sig: TAKE 1 TABLET BY MOUTH EVERY DAY     Cardiovascular: ARB + Diuretic Combos Failed - 06/01/2024  1:59 PM      Failed - K in normal range and within 180 days    Potassium  Date Value Ref Range Status  09/19/2023 4.2 3.5 - 5.1 mEq/L Final         Failed - Na in normal range and within 180 days    Sodium  Date Value Ref Range Status  09/19/2023 139 137 - 147 Final         Failed - Cr in normal range and within 180 days    Creatinine  Date Value Ref Range Status  09/19/2023 1.1 0.6 - 1.3 Final   Creat  Date Value Ref Range Status  03/01/2021 0.91 0.60 - 1.29 mg/dL Final         Failed - eGFR is 10 or above and within 180 days    GFR, Est African American  Date Value Ref Range Status  06/23/2018 131 > OR = 60 mL/min/1.11m2 Final   GFR calc Af Amer  Date Value Ref Range Status  09/08/2019 125 >59 mL/min/1.73 Final   GFR, Est Non African American  Date Value Ref Range Status  06/23/2018 113 > OR = 60 mL/min/1.53m2 Final   GFR calc non Af Amer  Date Value Ref Range Status  09/08/2019 108 >59 mL/min/1.73 Final   eGFR  Date Value Ref Range Status  10/04/2022 91  Final   EGFR  Date Value Ref Range Status  09/24/2023 80.0  Final    Comment:    Abstracted by HIM         Failed - Valid encounter within last 6 months    Recent Outpatient Visits           6 months ago Well adult exam   [facility] Glenard Mire, MD   9 months ago OSA on CPAP   [facility] Glenard Mire, MD               Passed - Patient is not pregnant      Passed - Last BP in normal range    BP Readings from Last 1 Encounters:  11/27/23 122/84

## 2024-07-06 ENCOUNTER — Other Ambulatory Visit: Payer: Self-pay | Admitting: Family Medicine

## 2024-07-06 DIAGNOSIS — J302 Other seasonal allergic rhinitis: Secondary | ICD-10-CM

## 2024-07-07 NOTE — Telephone Encounter (Signed)
 Requested Prescriptions  Pending Prescriptions Disp Refills   Azelastine  HCl 137 MCG/SPRAY SOLN [Pharmacy Med Name: AZELASTINE  0.1% (137 MCG) SPRY] 90 mL 1    Sig: PLACE 2 SPRAYS INTO BOTH NOSTRILS 2 (TWO) TIMES DAILY AS NEEDED.     Ear, Nose, and Throat: Nasal Preparations - Antiallergy Passed - 07/07/2024 12:54 PM      Passed - Valid encounter within last 12 months    Recent Outpatient Visits           7 months ago Well adult exam   [facility] Glenard Mire, MD   10 months ago OSA on CPAP   [facility] Glenard Mire, MD

## 2024-08-24 ENCOUNTER — Ambulatory Visit: Admitting: Family Medicine
